# Patient Record
Sex: Male | Born: 1955 | Race: White | Hispanic: No | Marital: Married | Smoking: Never smoker
Health system: Southern US, Community
[De-identification: ages and names within clinical notes are randomized; demographics above are authoritative.]

## PROBLEM LIST (undated history)

## (undated) DIAGNOSIS — E291 Testicular hypofunction: Secondary | ICD-10-CM

## (undated) DIAGNOSIS — J302 Other seasonal allergic rhinitis: Secondary | ICD-10-CM

## (undated) DIAGNOSIS — G4730 Sleep apnea, unspecified: Secondary | ICD-10-CM

## (undated) HISTORY — PX: ROTATOR CUFF REPAIR: SHX139

## (undated) HISTORY — PX: SPINE SURGERY: SHX786

## (undated) HISTORY — DX: Testicular hypofunction: E29.1

## (undated) HISTORY — DX: Sleep apnea, unspecified: G47.30

## (undated) HISTORY — PX: EYE SURGERY: SHX253

---

## 2007-06-29 ENCOUNTER — Ambulatory Visit: Payer: Self-pay | Admitting: Gastroenterology

## 2008-12-08 ENCOUNTER — Observation Stay: Payer: Self-pay | Admitting: Internal Medicine

## 2009-07-06 IMAGING — CR DG CHEST 1V PORT
1 series · 1 of 1 positions shown · non-contrast
Comparison: none

REASON FOR EXAM: chest pain
COMMENTS:   LMP: (Male)

[view not recorded]
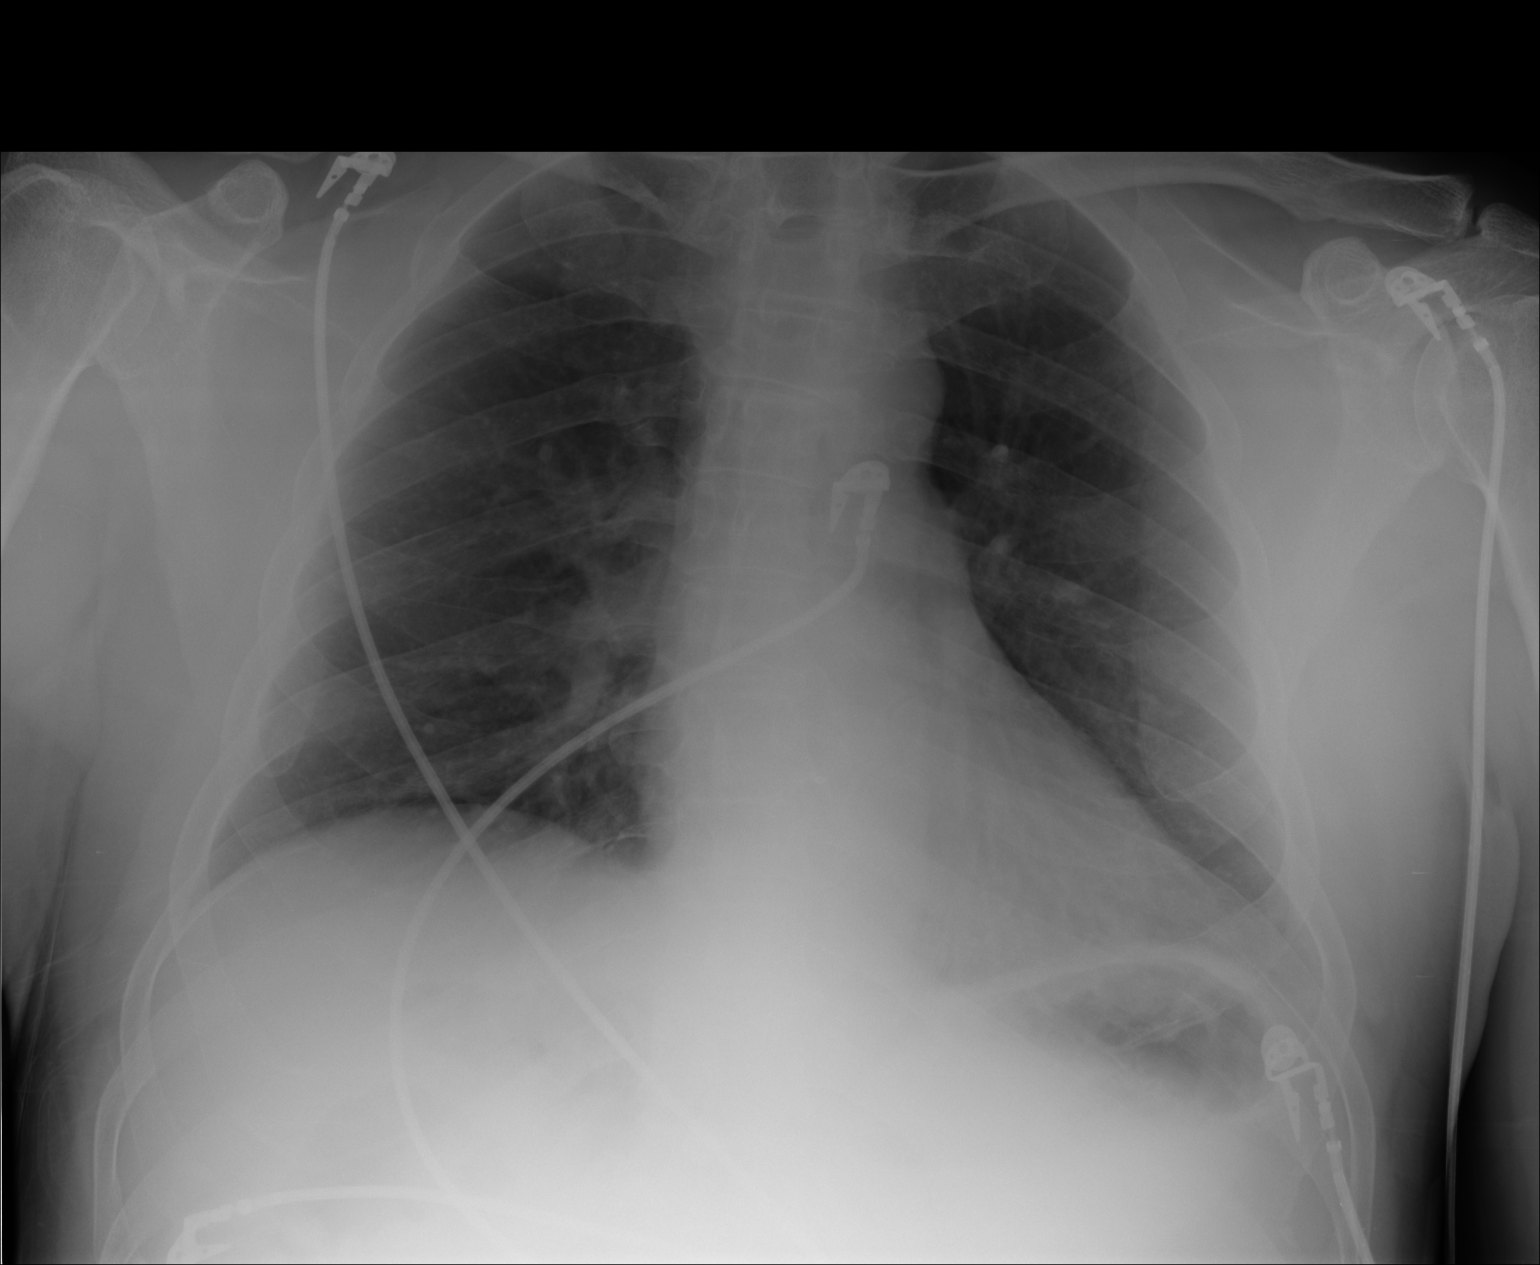

[1 of 1 positions shown; findings below may reference images not displayed]

PROCEDURE:     DXR - DXR PORTABLE CHEST SINGLE VIEW  - December 08, 2008  [DATE]

RESULT:     There is no previous exam for comparison.

Cardiac monitoring electrodes are present. The lungs are clear. The heart
and pulmonary vessels are normal. The bony and mediastinal structures are
unremarkable. There is no effusion. There is no pneumothorax or evidence of
congestive failure.
IMPRESSION: No acute cardiopulmonary disease. There is slightly shallow
inspiration.

## 2009-07-06 IMAGING — CT CT CHEST W/ CM
1 series · 16 of 33 positions shown, 20 images · non-contrast
Comparison: none

REASON FOR EXAM: sob, hi ddimer
COMMENTS:

[Series 4: soft tissue · axial · 0.73mm/px · z∈[-436,-115]mm · 16 of 117 slices shown, 20 images]
[im 5/117  mediastinal]
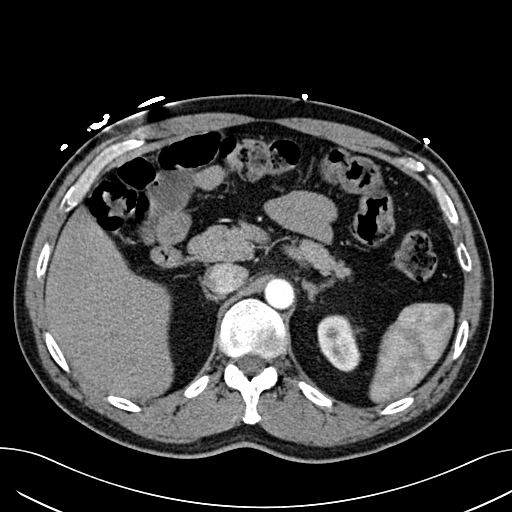
[im 5/117  lung]
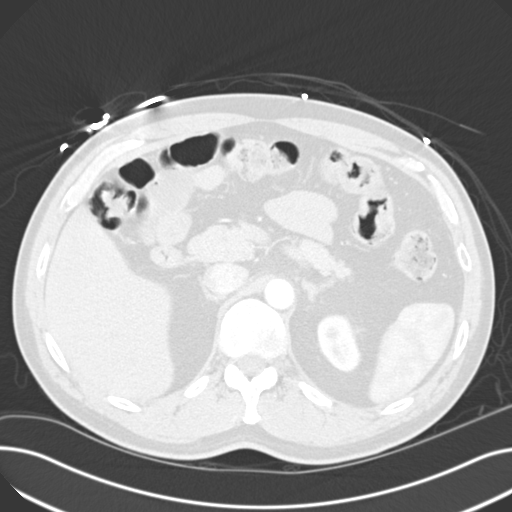
[im 13/117  lung]
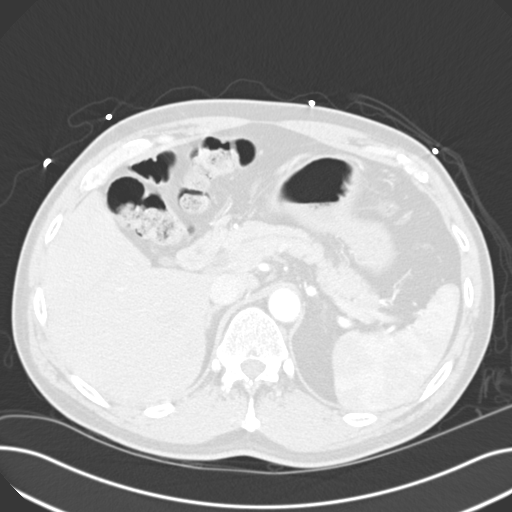
[im 22/117  lung]
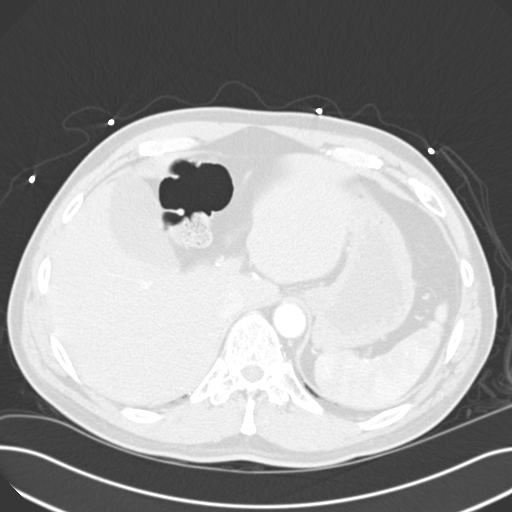
[im 26/117  lung]
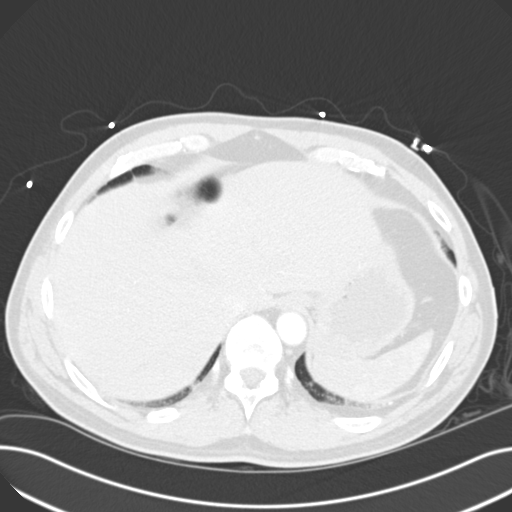
[im 35/117  mediastinal]
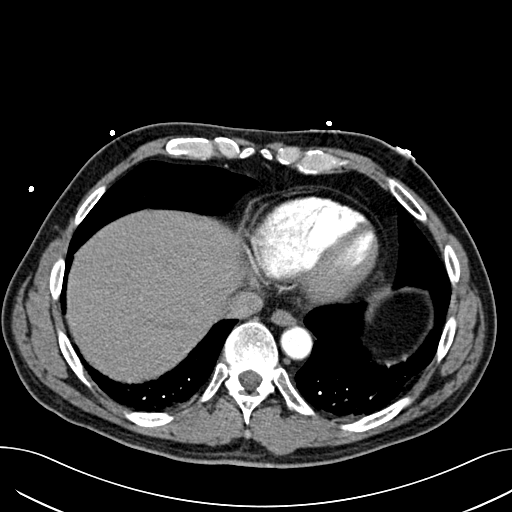
[im 35/117  lung]
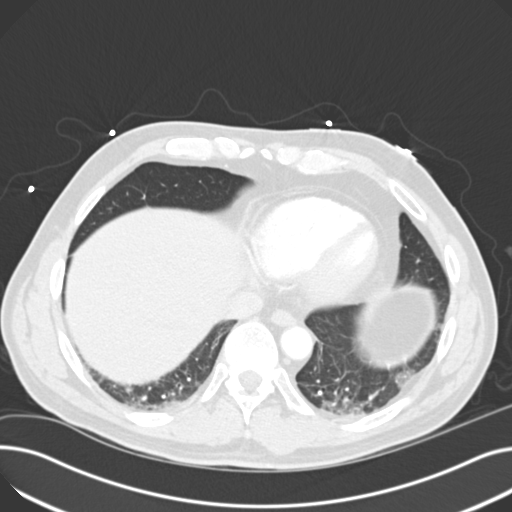
[im 43/117  lung]
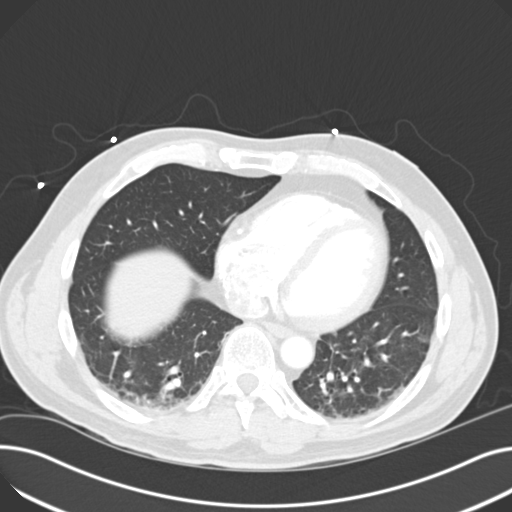
[im 48/117  lung]
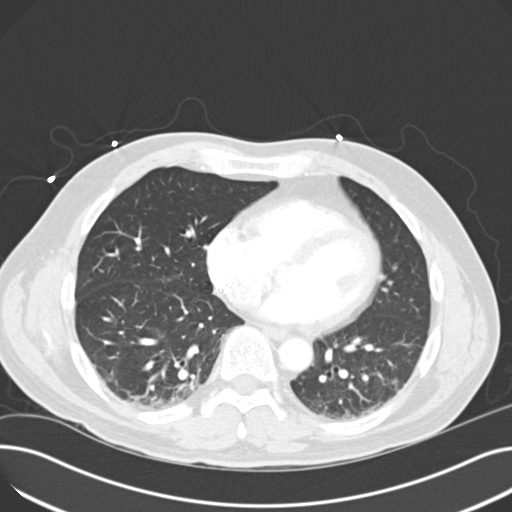
[im 56/117  lung]
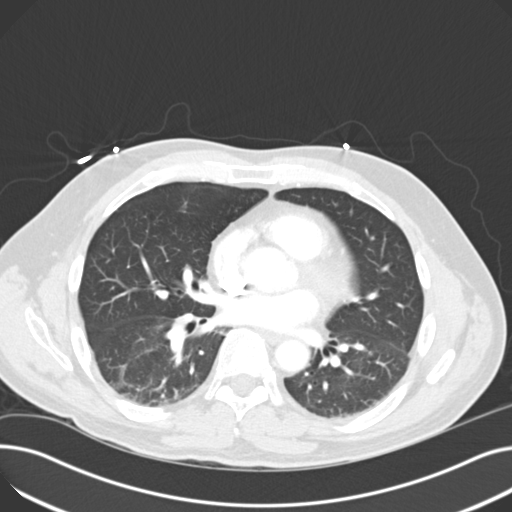
[im 62/117  mediastinal]
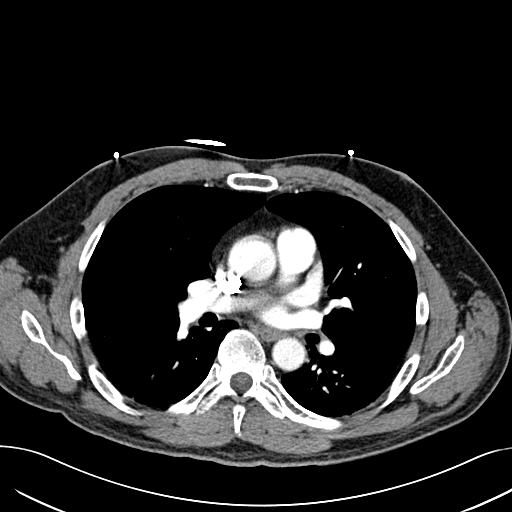
[im 62/117  lung]
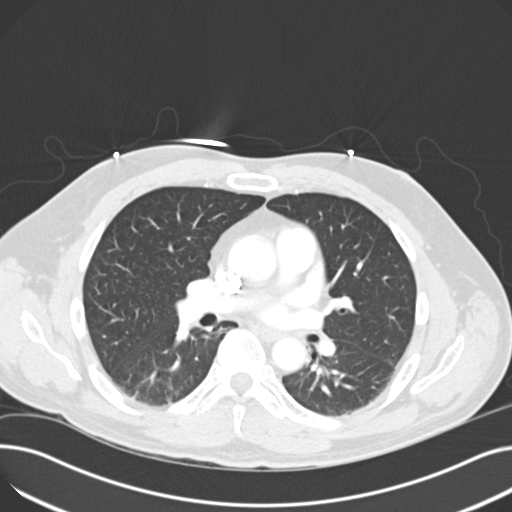
[im 69/117  lung]
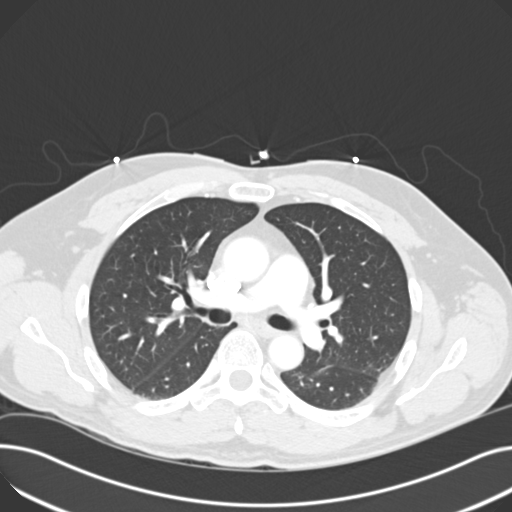
[im 74/117  lung]
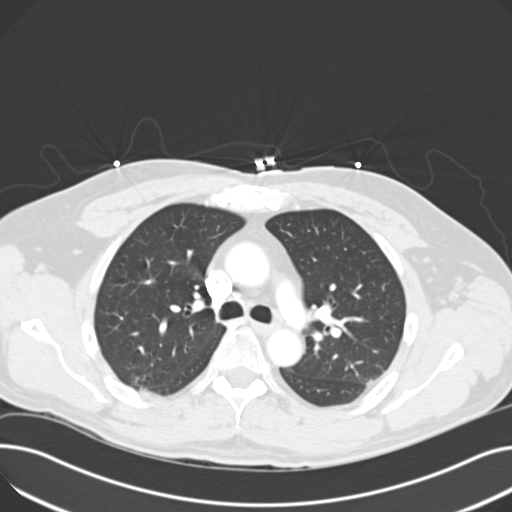
[im 82/117  lung]
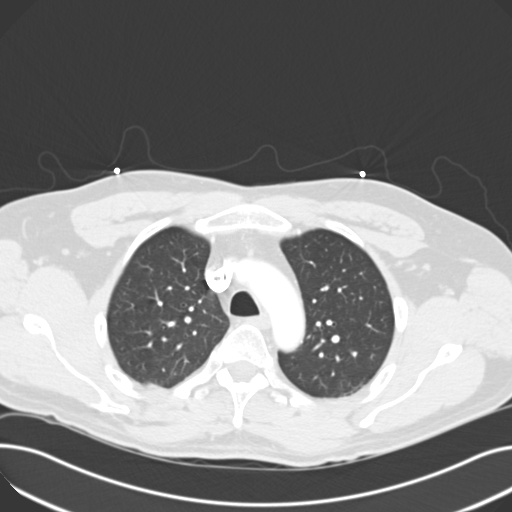
[im 91/117  mediastinal]
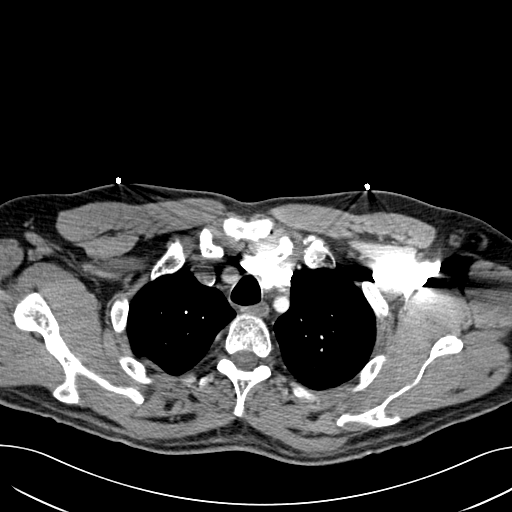
[im 91/117  lung]
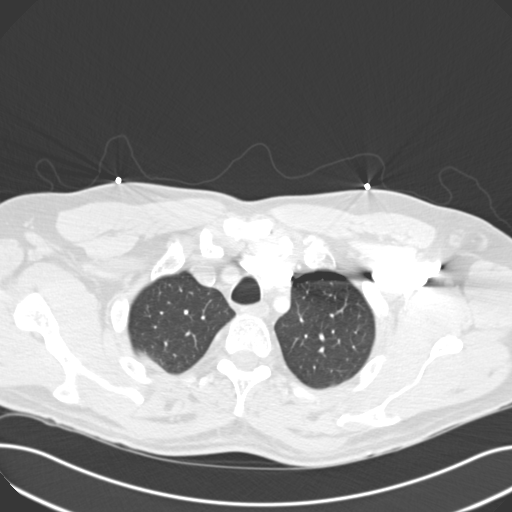
[im 95/117  lung]
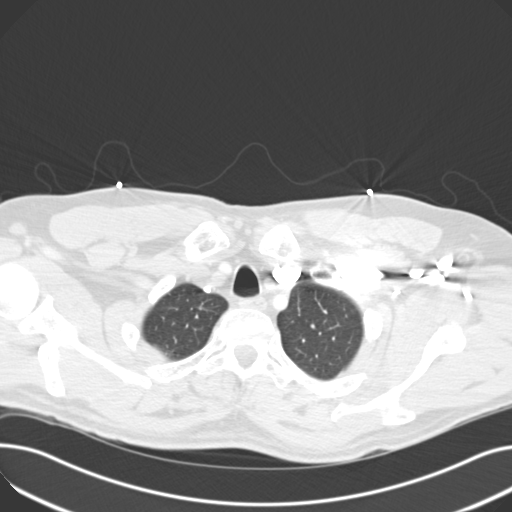
[im 104/117  lung]
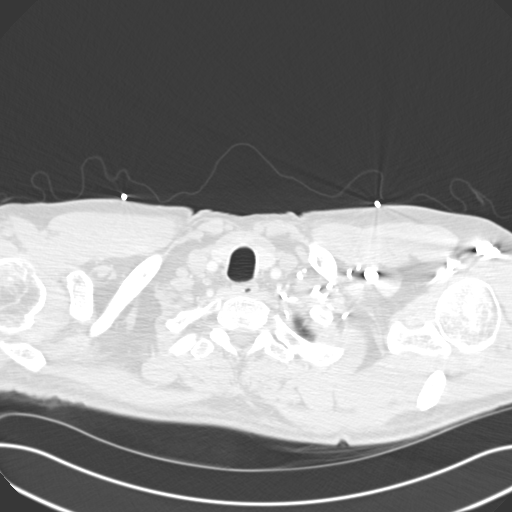
[im 112/117  lung]
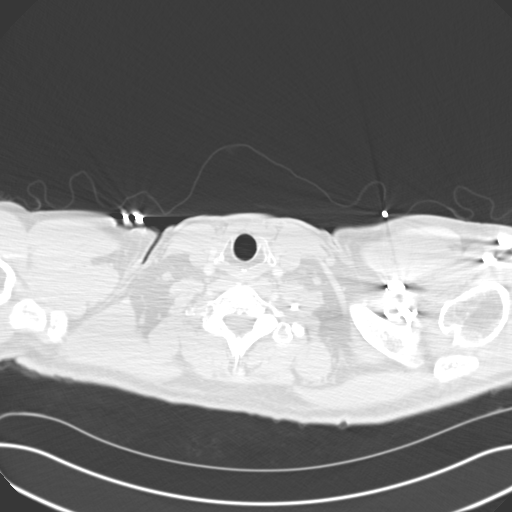

[16 of 33 positions shown; findings below may reference images not displayed]

PROCEDURE:     CT  - CT CHEST (FOR PE) W  - December 08, 2008  [DATE]

RESULT:     Emergent CT of the chest is performed utilizing approximately
100 ml of Wsovue-UHF iodinated intravenous contrast. There is no previous
examination for comparison. Images are reconstructed in the axial plane at 3
mm slice thickness. At the time of dictation multiplanar thin slices
reconstructions and 3 dimensional reconstructions are performed by the
interpreting physician utilizing the web space protocol. The abdominal aorta
is normal in caliber with no dissection or aneurysm. There is no pulmonary
embolism. There is no mediastinal or hilar mass or adenopathy. There is no
axillary adenopathy. The upper abdominal structures included on the same
appear unremarkable. There is no pericardial or pleural effusion. The lungs
are clear. There is no edema, infiltrate, mass or nodule. Dependent
atelectasis is present bilaterally.
IMPRESSION: 1. No pulmonary embolism.
2. No evidence of aortic aneurysm or dissection.

## 2013-02-09 ENCOUNTER — Ambulatory Visit: Payer: Self-pay | Admitting: Emergency Medicine

## 2013-02-09 LAB — DOT URINE DIP
Blood: NEGATIVE
Glucose,UR: NEGATIVE mg/dL (ref 0–75)
Protein: NEGATIVE
Specific Gravity: 1.02 (ref 1.003–1.030)

## 2015-01-09 ENCOUNTER — Ambulatory Visit: Payer: Self-pay

## 2015-01-09 LAB — DOT URINE DIP
Blood: NEGATIVE
GLUCOSE, UR: NEGATIVE
PROTEIN: NEGATIVE
Specific Gravity: 1.015 (ref 1.000–1.030)

## 2015-06-05 ENCOUNTER — Other Ambulatory Visit: Payer: Self-pay | Admitting: Family Medicine

## 2015-07-15 ENCOUNTER — Other Ambulatory Visit: Payer: Self-pay | Admitting: Family Medicine

## 2015-07-16 ENCOUNTER — Other Ambulatory Visit: Payer: Self-pay | Admitting: Family Medicine

## 2015-08-12 ENCOUNTER — Telehealth: Payer: Self-pay | Admitting: Family Medicine

## 2015-08-12 NOTE — Telephone Encounter (Signed)
Pt would like to know if an order can placed for testosterone. Pt would like a printed order before his appt on 10/01/15 @ 3pm. Please call pt when the order is ready to be picked up. Thanks.

## 2015-08-13 ENCOUNTER — Other Ambulatory Visit: Payer: Self-pay | Admitting: Family Medicine

## 2015-08-13 MED ORDER — TESTOSTERONE CYPIONATE 200 MG/ML IM SOLN: 50.0000 mg | INTRAMUSCULAR | Status: DC

## 2015-08-13 NOTE — Telephone Encounter (Signed)
Printed rx  

## 2015-08-15 ENCOUNTER — Telehealth: Payer: Self-pay | Admitting: Family Medicine

## 2015-08-15 NOTE — Telephone Encounter (Signed)
Pt came in and expected to pick up an order to get blood work done to check testosterone instead there was a rx and he is unsure why.

## 2015-08-18 NOTE — Telephone Encounter (Signed)
Call pt re testosterone, confusion over refilling patient's current prescription or blood work

## 2015-08-18 NOTE — Telephone Encounter (Signed)
Phone call Discussed with patient confusion over testosterone Patient has appointment next month Patient will come in this week to have morning testosterone drawn before 10:00 Reassess need for testosterone replacement therapy at that time

## 2015-09-19 ENCOUNTER — Other Ambulatory Visit: Payer: BLUE CROSS/BLUE SHIELD

## 2015-09-19 DIAGNOSIS — E291 Testicular hypofunction: Secondary | ICD-10-CM

## 2015-09-20 LAB — TESTOSTERONE: Testosterone: 245 ng/dL — ABNORMAL LOW (ref 348–1197)

## 2015-10-01 ENCOUNTER — Ambulatory Visit (INDEPENDENT_AMBULATORY_CARE_PROVIDER_SITE_OTHER): Payer: BLUE CROSS/BLUE SHIELD | Admitting: Family Medicine

## 2015-10-01 ENCOUNTER — Encounter: Payer: Self-pay | Admitting: Family Medicine

## 2015-10-01 VITALS — BP 106/74 | HR 72 | Temp 98.7°F | Ht 69.8 in | Wt 193.0 lb

## 2015-10-01 DIAGNOSIS — Z0000 Encounter for general adult medical examination without abnormal findings: Secondary | ICD-10-CM | POA: Diagnosis not present

## 2015-10-01 DIAGNOSIS — E291 Testicular hypofunction: Secondary | ICD-10-CM | POA: Diagnosis not present

## 2015-10-01 DIAGNOSIS — Z23 Encounter for immunization: Secondary | ICD-10-CM

## 2015-10-01 MED ORDER — FLUTICASONE PROPIONATE 50 MCG/ACT NA SUSP: 2.0000 | Freq: Every day | NASAL | Status: DC

## 2015-10-01 MED ORDER — TESTOSTERONE CYPIONATE 200 MG/ML IM SOLN: 100.0000 mg | INTRAMUSCULAR | Status: DC

## 2015-10-01 NOTE — Progress Notes (Signed)
BP 106/74 mmHg  Pulse 72  Temp(Src) 98.7 F (37.1 C)  Ht 5' 9.8" (1.773 m)  Wt 193 lb (87.544 kg)  BMI 27.85 kg/m2  SpO2 97%   Subjective:    Patient ID: William Numbers., male    DOB: 16-Feb-1956, 59 y.o.   MRN: [id number]  HPI: William Richardson. is a 59 y.o. male  Chief Complaint  Patient presents with  . Annual Exam   Patient doing well no complaints except for marked fatigue secondary to hypogonadal has not started testosterone replacement yet but ready to get going. His prescription was individual vials and wants multidose bottle.  Relevant past medical, surgical, family and social history reviewed and updated as indicated. Interim medical history since our last visit reviewed. Allergies and medications reviewed and updated.  Review of Systems  Constitutional: Negative.   HENT: Negative.   Eyes: Negative.   Respiratory: Negative.   Cardiovascular: Negative.   Gastrointestinal: Negative.   Endocrine: Negative.   Genitourinary: Negative.   Musculoskeletal: Negative.   Skin: Negative.   Allergic/Immunologic: Negative.   Neurological: Negative.   Hematological: Negative.   Psychiatric/Behavioral: Negative.     Per HPI unless specifically indicated above     Objective:    BP 106/74 mmHg  Pulse 72  Temp(Src) 98.7 F (37.1 C)  Ht 5' 9.8" (1.773 m)  Wt 193 lb (87.544 kg)  BMI 27.85 kg/m2  SpO2 97%  Wt Readings from Last 3 Encounters:  10/01/15 193 lb (87.544 kg)  02/27/15 196 lb (88.905 kg)    Physical Exam  Constitutional: He is oriented to person, place, and time. He appears well-developed and well-nourished.  HENT:  Head: Normocephalic and atraumatic.  Right Ear: External ear normal.  Left Ear: External ear normal.  Eyes: Conjunctivae and EOM are normal. Pupils are equal, round, and reactive to light.  Neck: Normal range of motion. Neck supple.  Cardiovascular: Normal rate, regular rhythm, normal heart sounds and intact distal  pulses.   Pulmonary/Chest: Effort normal and breath sounds normal.  Abdominal: Soft. Bowel sounds are normal. There is no splenomegaly or hepatomegaly.  Genitourinary: Rectum normal, prostate normal and penis normal.  Musculoskeletal: Normal range of motion.  Neurological: He is alert and oriented to person, place, and time. He has normal reflexes.  Skin: No rash noted. No erythema.  Psychiatric: He has a normal mood and affect. His behavior is normal. Judgment and thought content normal.    Results for orders placed or performed in visit on 09/19/15  Testosterone  Result Value Ref Range   Testosterone 245 (L) 348 - 1197 ng/dL   Comment, Testosterone Comment       Assessment & Plan:   Problem List Items Addressed This Visit      Endocrine   Hypogonadism in male    Other Visit Diagnoses    Immunization due    -  Primary    Relevant Orders    Flu Vaccine QUAD 36+ mos PF IM (Fluarix & Fluzone Quad PF) (Completed)    Tdap vaccine greater than or equal to 7yo IM (Completed)    PE (physical exam), annual        Relevant Orders    Comprehensive metabolic panel    CBC with Differential/Platelet    TSH    PSA    Urinalysis, Routine w reflex microscopic (not at [facility])        Follow up plan: Return in about 6 months (around 03/31/2016) for Recheck  meds with CBC, PSA and morning testosterone.Marland Kitchen

## 2015-11-17 ENCOUNTER — Telehealth: Payer: Self-pay | Admitting: Family Medicine

## 2015-11-17 NOTE — Telephone Encounter (Signed)
call

## 2015-11-17 NOTE — Telephone Encounter (Signed)
pts insurance isn't covering visit and would like it to be recoded. plz call pt for further explanation.

## 2015-11-21 NOTE — Progress Notes (Signed)
This encounter was created in error - please disregard.

## 2016-05-21 ENCOUNTER — Other Ambulatory Visit: Payer: Self-pay | Admitting: Family Medicine

## 2016-05-21 NOTE — Telephone Encounter (Signed)
Must have apt to check before refills (with anyone)

## 2016-05-24 ENCOUNTER — Encounter: Payer: Self-pay | Admitting: Family Medicine

## 2016-05-24 NOTE — Telephone Encounter (Signed)
Letter sent.

## 2016-10-07 ENCOUNTER — Ambulatory Visit (INDEPENDENT_AMBULATORY_CARE_PROVIDER_SITE_OTHER): Payer: BLUE CROSS/BLUE SHIELD | Admitting: Family Medicine

## 2016-10-07 ENCOUNTER — Encounter: Payer: Self-pay | Admitting: Family Medicine

## 2016-10-07 VITALS — BP 117/80 | HR 58 | Temp 97.9°F | Ht 70.4 in | Wt 195.0 lb

## 2016-10-07 DIAGNOSIS — Z0000 Encounter for general adult medical examination without abnormal findings: Secondary | ICD-10-CM

## 2016-10-07 DIAGNOSIS — E291 Testicular hypofunction: Secondary | ICD-10-CM

## 2016-10-07 NOTE — Patient Instructions (Signed)
Follow up as needed

## 2016-10-07 NOTE — Assessment & Plan Note (Signed)
Stable, not currently on testosterone replacement

## 2016-10-07 NOTE — Progress Notes (Signed)
BP 117/80 (BP Location: Right Arm, Patient Position: Sitting, Cuff Size: Normal)   Pulse (!) 58   Temp 97.9 F (36.6 C)   Ht 5' 10.4" (1.788 m)   Wt 195 lb (88.5 kg)   SpO2 98%   BMI 27.66 kg/m    Subjective:    Patient ID: William Numbers., male    DOB: 09-03-1956, 60 y.o.   MRN: [id number]  HPI: William Bea. is a 60 y.o. male presenting on 10/07/2016 for comprehensive medical examination. Current medical complaints include:none  Interim Problems from his last visit: no  Depression Screen done today and results listed below:  Depression screen [facility] 2/9 10/07/2016  Decreased Interest 0  Down, Depressed, Hopeless 0  PHQ - 2 Score 0  Altered sleeping 0  Tired, decreased energy 0  Change in appetite 0  Feeling bad or failure about yourself  0  Trouble concentrating 0  Moving slowly or fidgety/restless 0  Suicidal thoughts 0  PHQ-9 Score 0    The patient does not have a history of falls. I did not complete a risk assessment for falls. A plan of care for falls was not documented.   Past Medical History:  Past Medical History:  Diagnosis Date  . Hypogonadism in male     Surgical History:  Past Surgical History:  Procedure Laterality Date  . EYE SURGERY Bilateral    cataract  . SPINE SURGERY      Medications:  Current Outpatient Prescriptions on File Prior to Visit  Medication Sig  . fluticasone (FLONASE) 50 MCG/ACT nasal spray Place 2 sprays into both nostrils daily.  . valACYclovir (VALTREX) 500 MG tablet TAKE 4 TABLETS BY MOUTH NOW AND REPEAT IN 12 HOURS  . testosterone cypionate (DEPOTESTOSTERONE CYPIONATE) 200 MG/ML injection Inject 0.5 mLs (100 mg total) into the muscle once a week. (Patient not taking: Reported on 10/07/2016)   No current facility-administered medications on file prior to visit.     Allergies:  No Known Allergies  Social History:  Social History   Social History  . Marital status: Married    Spouse name:  N/A  . Number of children: N/A  . Years of education: N/A   Occupational History  . Not on file.   Social History Main Topics  . Smoking status: Never Smoker  . Smokeless tobacco: Never Used  . Alcohol use No  . Drug use: No  . Sexual activity: Not on file   Other Topics Concern  . Not on file   Social History Narrative  . No narrative on file   History  Smoking Status  . Never Smoker  Smokeless Tobacco  . Never Used   History  Alcohol Use No    Family History:  Family History  Problem Relation Age of Onset  . Cancer Mother   . Dementia Mother   . Diabetes Brother     Past medical history, surgical history, medications, allergies, family history and social history reviewed with patient today and changes made to appropriate areas of the chart.   Review of Systems - General ROS: negative Psychological ROS: negative Ophthalmic ROS: negative ENT ROS: negative Endocrine ROS: negative Respiratory ROS: no cough, shortness of breath, or wheezing Cardiovascular ROS: no chest pain or dyspnea on exertion Gastrointestinal ROS: no abdominal pain, change in bowel habits, or black or bloody stools Genito-Urinary ROS: no dysuria, trouble voiding, or hematuria Musculoskeletal ROS: negative Neurological ROS: no TIA or stroke symptoms Dermatological ROS: negative  All other ROS negative except what is listed above and in the HPI.      Objective:    BP 117/80 (BP Location: Right Arm, Patient Position: Sitting, Cuff Size: Normal)   Pulse (!) 58   Temp 97.9 F (36.6 C)   Ht 5' 10.4" (1.788 m)   Wt 195 lb (88.5 kg)   SpO2 98%   BMI 27.66 kg/m   Wt Readings from Last 3 Encounters:  10/07/16 195 lb (88.5 kg)  10/01/15 193 lb (87.5 kg)  02/27/15 196 lb (88.9 kg)    Physical Exam  Constitutional: He is oriented to person, place, and time. He appears well-developed and well-nourished. No distress.  HENT:  Head: Atraumatic.  Right Ear: External ear normal.  Left Ear:  External ear normal.  Nose: Nose normal.  Mouth/Throat: Oropharynx is clear and moist. No oropharyngeal exudate.  Eyes: Conjunctivae and EOM are normal. Pupils are equal, round, and reactive to light. No scleral icterus.  Neck: Normal range of motion. Neck supple.  Cardiovascular: Normal rate, regular rhythm and normal heart sounds.   Pulmonary/Chest: Effort normal and breath sounds normal. No respiratory distress. He has no wheezes. He has no rales.  Abdominal: Soft. Bowel sounds are normal. There is no tenderness.  Musculoskeletal: Normal range of motion. He exhibits no edema or tenderness.  Lymphadenopathy:    He has no cervical adenopathy.  Neurological: He is alert and oriented to person, place, and time. No cranial nerve deficit. He exhibits normal muscle tone.  Skin: Skin is warm and dry. No rash noted.  Psychiatric: He has a normal mood and affect. His behavior is normal.  Nursing note and vitals reviewed.     Assessment & Plan:   Problem List Items Addressed This Visit      Endocrine   Hypogonadism in male    Stable, not currently on testosterone replacement       Other Visit Diagnoses    Annual physical exam    -  Primary   Labs done previously through [facility]       Discussed aspirin prophylaxis for myocardial infarction prevention and decision was it was not indicated  LABORATORY TESTING:  Health maintenance labs ordered today as discussed above.   The natural history of prostate cancer and ongoing controversy regarding screening and potential treatment outcomes of prostate cancer has been discussed with the patient. The meaning of a false positive PSA and a false negative PSA has been discussed. He indicates understanding of the limitations of this screening test and wishes not to proceed with screening PSA testing.   IMMUNIZATIONS:   - Tdap: Tetanus vaccination status reviewed: last tetanus booster within 10 years. - Influenza: Refused - Zostavax vaccine: Up  to date  SCREENING: - Colonoscopy: Up to date  Discussed with patient purpose of the colonoscopy is to detect colon cancer at curable precancerous or early stages   PATIENT COUNSELING:    Sexuality: Discussed sexually transmitted diseases, partner selection, use of condoms, avoidance of unintended pregnancy  and contraceptive alternatives.   Advised to avoid cigarette smoking.  I discussed with the patient that most people either abstain from alcohol or drink within safe limits (<=14/week and <=4 drinks/occasion for males, <=7/weeks and <= 3 drinks/occasion for females) and that the risk for alcohol disorders and other health effects rises proportionally with the number of drinks per week and how often a drinker exceeds daily limits.  Discussed cessation/primary prevention of drug use and availability of treatment for abuse.  Diet: Encouraged to adjust caloric intake to maintain  or achieve ideal body weight, to reduce intake of dietary saturated fat and total fat, to limit sodium intake by avoiding high sodium foods and not adding table salt, and to maintain adequate dietary potassium and calcium preferably from fresh fruits, vegetables, and low-fat dairy products.    stressed the importance of regular exercise  Injury prevention: Discussed safety belts, safety helmets, smoke detector, smoking near bedding or upholstery.   Dental health: Discussed importance of regular tooth brushing, flossing, and dental visits.   Follow up plan: NEXT PREVENTATIVE PHYSICAL DUE IN 1 YEAR. Return in about 1 year (around 10/07/2017) for Physical Exam.

## 2016-12-10 ENCOUNTER — Encounter: Payer: Self-pay | Admitting: *Deleted

## 2016-12-10 ENCOUNTER — Ambulatory Visit
Admission: EM | Admit: 2016-12-10 | Discharge: 2016-12-10 | Disposition: A | Discharge status: Home / Self Care | Payer: Self-pay | Attending: Internal Medicine | Admitting: Internal Medicine

## 2016-12-10 DIAGNOSIS — Z024 Encounter for examination for driving license: Secondary | ICD-10-CM

## 2016-12-10 LAB — DEPT OF TRANSP DIPSTICK, URINE (ARMC ONLY)
Bilirubin Urine: NEGATIVE
GLUCOSE, UA: NEGATIVE mg/dL
Hgb urine dipstick: NEGATIVE
Ketones, ur: NEGATIVE mg/dL
Leukocytes, UA: NEGATIVE
NITRITE: NEGATIVE
PH: 6 (ref 5.0–8.0)
PROTEIN: NEGATIVE mg/dL
SPECIFIC GRAVITY, URINE: 1.015 (ref 1.005–1.030)

## 2016-12-10 NOTE — Discharge Instructions (Signed)
You have been certified for 2 years. Follow-up in 2 years for repeat exam.

## 2016-12-10 NOTE — ED Provider Notes (Signed)
CSN: [id number]     Arrival date & time 12/10/16  0831 History   First MD Initiated Contact with Patient 12/10/16 [phone]     Chief Complaint  Patient presents with  . Commercial Driver's License Exam   (Consider location/radiation/quality/duration/timing/severity/associated sxs/prior Treatment) 60 year old male presents for a routine DOT physical. Last DOT physical was 2 years ago. No chronic health issues. Had recent physical at his PCP with no concerns. Did have history of chest pain about 3 years ago, had full work-up with cardiac cath- no etiology found. Has not had any chest pain, difficulty breathing or other symptoms since. Also had cervical spinal surgery 30 years ago- no symptoms, pain or difficulties since- has full range of motion of neck. Occasionally takes Flonase for seasonal allergies. No other concerns or complaints.    The history is provided by the patient.    Past Medical History:  Diagnosis Date  . Hypogonadism in male    Past Surgical History:  Procedure Laterality Date  . EYE SURGERY Bilateral    cataract  . SPINE SURGERY     Family History  Problem Relation Age of Onset  . Cancer Mother   . Dementia Mother   . Diabetes Brother    Social History  Substance Use Topics  . Smoking status: Never Smoker  . Smokeless tobacco: Never Used  . Alcohol use No    Review of Systems  Constitutional: Negative.   HENT: Negative.   Eyes: Positive for visual disturbance (is color impaired- but able to distinguish red and green).  Respiratory: Negative.   Cardiovascular: Negative for chest pain, palpitations and leg swelling.  Gastrointestinal: Negative.   Endocrine: Negative.   Genitourinary: Negative.   Musculoskeletal: Negative for arthralgias, back pain, gait problem, joint swelling, myalgias, neck pain and neck stiffness.  Skin: Negative.   Allergic/Immunologic: Negative.   Neurological: Negative.   Hematological: Negative.   Psychiatric/Behavioral: Negative.      Allergies  Patient has no known allergies.  Home Medications   Prior to Admission medications   Medication Sig Start Date End Date Taking? Authorizing Provider  fluticasone (FLONASE) 50 MCG/ACT nasal spray Place 2 sprays into both nostrils daily. 10/01/15   Guadalupe Maple, MD   Meds Ordered and Administered this Visit  Medications - No data to display  BP 120/81 (BP Location: Left Arm)   Pulse 85   Temp 97.5 F (36.4 C) (Oral)   Resp 16   Ht 5\' 10"  (1.778 m)   Wt 192 lb (87.1 kg)   SpO2 97%   BMI 27.55 kg/m  No data found.   Physical Exam  Constitutional: He is oriented to person, place, and time. He appears well-developed and well-nourished. No distress.  HENT:  Head: Normocephalic and atraumatic.  Right Ear: Hearing, tympanic membrane, external ear and ear canal normal.  Left Ear: Hearing, tympanic membrane, external ear and ear canal normal.  Nose: Nose normal.  Mouth/Throat: Uvula is midline, oropharynx is clear and moist and mucous membranes are normal.  Eyes: Conjunctivae, EOM and lids are normal. Pupils are equal, round, and reactive to light. Right eye exhibits no discharge. Left eye exhibits no discharge.  Neck: Normal range of motion and full passive range of motion without pain. Neck supple.    Scar from previous neck surgery  Cardiovascular: Normal rate, regular rhythm, normal heart sounds, intact distal pulses and normal pulses.  Exam reveals no friction rub.   No murmur heard. Pulmonary/Chest: Effort normal and breath  sounds normal. No respiratory distress. He has no decreased breath sounds. He has no wheezes. He has no rhonchi.  Abdominal: Soft. Normal appearance and bowel sounds are normal. He exhibits no distension. There is no hepatosplenomegaly. There is no tenderness. No hernia. Hernia confirmed negative in the right inguinal area and confirmed negative in the left inguinal area.  Musculoskeletal: Normal range of motion.  Neurological: He is  alert and oriented to person, place, and time. He has normal strength and normal reflexes. No cranial nerve deficit or sensory deficit. He displays a negative Romberg sign.  Skin: Skin is warm and dry. Capillary refill takes less than 2 seconds.  Psychiatric: He has a normal mood and affect. His behavior is normal. Judgment and thought content normal.    Urgent Care Course   Clinical Course     Procedures (including critical care time)  Labs Review Labs Reviewed  DEPT OF TRANSP DIPSTICK, URINE([facility] ONLY)    Imaging Review No results found.   Visual Acuity Review   Right Eye Distance:  20/25 Uncorrected  Left Eye Distance:  20/20 Bilateral Distance:  20/20   Right Eye Near:   Left Eye Near:    Bilateral Near:         MDM   1. Encounter for commercial driver medical examination (CDME)    Reviewed negative urinalysis results with patient. No abnormalities on exam detected. Patient certified for 2 years. Paperwork completed- see DOT paperwork for details. Follow-up in 2 years for repeat exam.     Katy Apo, NP 12/10/16 1109

## 2016-12-10 NOTE — ED Triage Notes (Signed)
DOT physical. 

## 2017-04-06 ENCOUNTER — Other Ambulatory Visit: Payer: Self-pay | Admitting: Family Medicine

## 2017-04-06 NOTE — Telephone Encounter (Signed)
NOT ON CURRENT MED LIST!    Last routine OV: 09/19/2016 Next OV: 10/18/2017

## 2017-04-06 NOTE — Telephone Encounter (Signed)
Message relayed to patient. Verbalized understanding and denied questions. Pt will hold off for the time being will call back to schedule if he changed his mind.

## 2017-04-06 NOTE — Telephone Encounter (Signed)
Call patient requesting refill for testosterone last prescription was given in 2016 patient must have office visit prior to any prescriptions. In general now testosterone replacement is being managed by urology due to the current political climate on controlled drugs.

## 2017-05-12 ENCOUNTER — Ambulatory Visit
Admission: EM | Admit: 2017-05-12 | Discharge: 2017-05-12 | Disposition: A | Discharge status: Home / Self Care | Payer: Worker's Compensation | Attending: Family Medicine | Admitting: Family Medicine

## 2017-05-12 ENCOUNTER — Ambulatory Visit (INDEPENDENT_AMBULATORY_CARE_PROVIDER_SITE_OTHER): Payer: Worker's Compensation

## 2017-05-12 ENCOUNTER — Encounter: Payer: Self-pay | Admitting: *Deleted

## 2017-05-12 DIAGNOSIS — M7520 Bicipital tendinitis, unspecified shoulder: Secondary | ICD-10-CM

## 2017-05-12 DIAGNOSIS — S40012A Contusion of left shoulder, initial encounter: Secondary | ICD-10-CM

## 2017-05-12 MED ORDER — MELOXICAM 15 MG PO TABS: 15.0000 mg | ORAL_TABLET | Freq: Every day | ORAL | 0 refills | Status: DC

## 2017-05-12 NOTE — ED Triage Notes (Signed)
Pt was pulling a box off of a shelf overhead and it stuck then released suddenly jerking left shoulder. Now c/o left shoulder pain and decreased ROM, injury occurred yesterday.

## 2017-05-12 NOTE — ED Provider Notes (Signed)
[facility]    CSN: [id number] Arrival date & time: 05/12/17  1309     History   Chief Complaint Chief Complaint  Patient presents with  . Shoulder Injury    HPI William Richardson. is a 61 y.o. male.   Patient is a 61 year old white male works for load [employer]. States that he was unloading the truck yesterday where a rack of box of food was stuck. He states that back to work to get loose and when he did finally get loose and went lying in him and bridge of his nose and then along the left shoulder. States that since the injury he's had difficulty with left shoulder. Other than the bruising of the left nose he states no trouble with the no facial or nasal area. Please having trouble is a left shoulder he has pain when he tries to move his shoulder to use his shoulder or any activity with shoulder. Past history of hypogonadism. History of eye and spine surgery. No loss of consciousness with the accident. No known drug allergies he does not smoke. His mother has cancer dementia but no pertinent family medical history relevant to today's visit.   The history is provided by the patient. No language interpreter was used.  Shoulder Injury  This is a new problem. The current episode started yesterday. The problem occurs constantly. The problem has been gradually worsening. Pertinent negatives include no chest pain, no abdominal pain, no headaches and no shortness of breath. The symptoms are aggravated by exertion. Nothing relieves the symptoms. He has tried nothing for the symptoms. The treatment provided no relief.    Past Medical History:  Diagnosis Date  . Hypogonadism in male     Patient Active Problem List   Diagnosis Date Noted  . Hypogonadism in male 10/01/2015    Past Surgical History:  Procedure Laterality Date  . EYE SURGERY Bilateral    cataract  . SPINE SURGERY         Home Medications    Prior to Admission medications   Medication Sig  Start Date End Date Taking? Authorizing Provider  fluticasone (FLONASE) 50 MCG/ACT nasal spray Place 2 sprays into both nostrils daily. 10/01/15   Guadalupe Maple, MD  meloxicam (MOBIC) 15 MG tablet Take 1 tablet (15 mg total) by mouth daily. 05/12/17   Frederich Cha, MD    Family History Family History  Problem Relation Age of Onset  . Cancer Mother   . Dementia Mother   . Diabetes Brother     Social History Social History  Substance Use Topics  . Smoking status: Never Smoker  . Smokeless tobacco: Never Used  . Alcohol use No     Allergies   Patient has no known allergies.   Review of Systems Review of Systems  Respiratory: Negative for shortness of breath.   Cardiovascular: Negative for chest pain.  Gastrointestinal: Negative for abdominal pain.  Musculoskeletal: Positive for myalgias.  Neurological: Negative for headaches.  All other systems reviewed and are negative.    Physical Exam Triage Vital Signs ED Triage Vitals  Enc Vitals Group     BP 05/12/17 1347 100/66     Pulse Rate 05/12/17 1347 86     Resp 05/12/17 1347 16     Temp 05/12/17 1347 98.2 F (36.8 C)     Temp Source 05/12/17 1347 Oral     SpO2 05/12/17 1347 98 %     Weight 05/12/17 1347 185 lb (  83.9 kg)     Height 05/12/17 1347 5\' 11"  (1.803 m)     Head Circumference --      Peak Flow --      Pain Score 05/12/17 1348 9     Pain Loc --      Pain Edu? --      Excl. in [place]? --    No data found.   Updated Vital Signs BP 100/66 (BP Location: Left Arm)   Pulse 86   Temp 98.2 F (36.8 C) (Oral)   Resp 16   Ht 5\' 11"  (1.803 m)   Wt 185 lb (83.9 kg)   SpO2 98%   BMI 25.80 kg/m   Visual Acuity Right Eye Distance:   Left Eye Distance:   Bilateral Distance:    Right Eye Near:   Left Eye Near:    Bilateral Near:     Physical Exam  Constitutional: He is oriented to person, place, and time. He appears well-developed and well-nourished.  HENT:  Head: Normocephalic. Head is with  contusion. Head is without Battle's sign.    Right Ear: External ear normal.  Left Ear: External ear normal.  Bruising over the nasal area  Eyes: Pupils are equal, round, and reactive to light.  Neck: Normal range of motion.  Pulmonary/Chest: Effort normal.  Musculoskeletal: He exhibits tenderness.       Left shoulder: He exhibits decreased range of motion, tenderness and bony tenderness.  Patient has tenderness over the left shoulder the pain appears be over the biceps tendon consistent with a biceps tendinitis. Limited range of motion the patient able to invert his hand with his arms straight out and resist pressure and with prompting able to raise his left arm above his head.  Neurological: He is alert and oriented to person, place, and time.  Skin: Skin is warm.  Psychiatric: He has a normal mood and affect.     UC Treatments / Results  Labs (all labs ordered are listed, but only abnormal results are displayed) Labs Reviewed - No data to display  EKG  EKG Interpretation None       Radiology Dg Shoulder Left  Result Date: 05/12/2017 CLINICAL DATA:  61 year old male status post blunt trauma to the left shoulder while lifting overhead yesterday. Pain and decreased range of motion. EXAM: LEFT SHOULDER - 2+ VIEW COMPARISON:  Chest CT 12/08/2008 FINDINGS: No glenohumeral joint dislocation. Bone mineralization is within normal limits for age. Intact proximal left humerus. Left clavicle and scapula appear intact and within normal limits. Negative visible left ribs and lung parenchyma. IMPRESSION: Normal for age radiographic appearance of the left shoulder. Electronically Signed   By: Genevie Ann M.D.   On: 05/12/2017 14:26    Procedures Procedures (including critical care time)  Medications Ordered in UC Medications - No data to display   Initial Impression / Assessment and Plan / UC Course  I have reviewed the triage vital signs and the nursing notes.  Pertinent labs & imaging  results that were available during my care of the patient were reviewed by me and considered in my medical decision making (see chart for details).     We'll place patient in a sling put on Mobic 15 mg refer patient to miss Adelina Mings at [facility]. He will need to call to make appointment to be seen next week we'll give him a work note for today but explained to him why he needs to go back to work tomorrow but  not lifting anything with his left arm or left upper extremities while at work until cleared by Ms Laurance Flatten.     Final Clinical Impressions(s) / UC Diagnoses   Final diagnoses:  Contusion of left shoulder, initial encounter  Biceps tendinitis, unspecified laterality    New Prescriptions New Prescriptions   MELOXICAM (MOBIC) 15 MG TABLET    Take 1 tablet (15 mg total) by mouth daily.    Note: This dictation was prepared with Dragon dictation along with smaller phrase technology. Any transcriptional errors that result from this process are unintentional.   Frederich Cha, MD 05/12/17 [id number]

## 2017-05-23 ENCOUNTER — Telehealth: Payer: Self-pay | Admitting: Family Medicine

## 2017-05-23 NOTE — Telephone Encounter (Signed)
Called and spoke to patient. Verified that unfortunately we are unable to send to [facility] due to [facility] not having any requisitions for them. Pt understood. Pt was given address of [facility] draw center near [facility].

## 2017-10-13 ENCOUNTER — Encounter: Payer: Self-pay | Admitting: Family Medicine

## 2017-10-13 ENCOUNTER — Ambulatory Visit (INDEPENDENT_AMBULATORY_CARE_PROVIDER_SITE_OTHER): Payer: BLUE CROSS/BLUE SHIELD | Admitting: Family Medicine

## 2017-10-13 VITALS — BP 113/80 | HR 47 | Ht 71.0 in | Wt 189.0 lb

## 2017-10-13 DIAGNOSIS — Z0000 Encounter for general adult medical examination without abnormal findings: Secondary | ICD-10-CM

## 2017-10-13 DIAGNOSIS — E291 Testicular hypofunction: Secondary | ICD-10-CM | POA: Diagnosis not present

## 2017-10-13 DIAGNOSIS — Z23 Encounter for immunization: Secondary | ICD-10-CM

## 2017-10-13 NOTE — Progress Notes (Signed)
BP 113/80   Pulse (!) 47   Ht 5\' 11"  (1.803 m)   Wt 189 lb (85.7 kg)   SpO2 95%   BMI 26.36 kg/m    Subjective:    Patient ID: William Numbers., male    DOB: [birth year], 61 y.o.   MRN: [id number]  HPI: [name]. is a 61 y.o. male  Annual exam  Patient all in all doing well no complaints is concerned about some actinic keratosis on the scalp.. Had shoulder surgery and is recovering still taking some occasional tramadol and takes meloxicam pretty much every day. Patient's blood work done at work after the new year. Relevant past medical, surgical, family and social history reviewed and updated as indicated. Interim medical history since our last visit reviewed. Allergies and medications reviewed and updated.  Review of Systems  Constitutional: Negative.   HENT: Negative.   Eyes: Negative.   Respiratory: Negative.   Cardiovascular: Negative.   Gastrointestinal: Negative.   Endocrine: Negative.   Genitourinary: Negative.   Musculoskeletal: Negative.   Skin: Negative.   Allergic/Immunologic: Negative.   Neurological: Negative.   Hematological: Negative.   Psychiatric/Behavioral: Negative.     Per HPI unless specifically indicated above     Objective:    BP 113/80   Pulse (!) 47   Ht 5\' 11"  (1.803 m)   Wt 189 lb (85.7 kg)   SpO2 95%   BMI 26.36 kg/m   Wt Readings from Last 3 Encounters:  10/13/17 189 lb (85.7 kg)  05/12/17 185 lb (83.9 kg)  12/10/16 192 lb (87.1 kg)    Physical Exam  Constitutional: He is oriented to person, place, and time. He appears well-developed and well-nourished.  HENT:  Head: Normocephalic and atraumatic.  Right Ear: External ear normal.  Left Ear: External ear normal.  Eyes: Pupils are equal, round, and reactive to light. Conjunctivae and EOM are normal.  Neck: Normal range of motion. Neck supple.  Cardiovascular: Normal rate, regular rhythm, normal heart sounds and intact distal pulses.     Pulmonary/Chest: Effort normal and breath sounds normal.  Abdominal: Soft. Bowel sounds are normal. There is no splenomegaly or hepatomegaly.  Genitourinary: Rectum normal, prostate normal and penis normal.  Musculoskeletal: Normal range of motion.  Neurological: He is alert and oriented to person, place, and time. He has normal reflexes.  Skin: No rash noted. No erythema.  Scalp with multiple actinic keratosis patient has a creamy as an use reviewed with patient and encouraged use.  Psychiatric: He has a normal mood and affect. His behavior is normal. Judgment and thought content normal.    Results for orders placed or performed during the hospital encounter of 12/10/16  Dept of Transp dipstick, urine  Result Value Ref Range   Protein, ur NEGATIVE NEGATIVE mg/dL   Glucose, UA NEGATIVE NEGATIVE mg/dL   Specific Gravity, Urine 1.015 1.005 - 1.030   Hgb urine dipstick NEGATIVE NEGATIVE   Color, Urine YELLOW YELLOW   APPearance CLEAR CLEAR   pH 6.0 5.0 - 8.0   Bilirubin Urine NEGATIVE NEGATIVE   Ketones, ur NEGATIVE NEGATIVE mg/dL   Nitrite NEGATIVE NEGATIVE   Leukocytes, UA NEGATIVE NEGATIVE      Assessment & Plan:   Problem List Items Addressed This Visit      Endocrine   Hypogonadism in male - Primary    Other Visit Diagnoses    Needs flu shot       PE (physical exam), annual  No blood work is being done at work.  Follow up plan: Return for Physical Exam.

## 2017-10-20 ENCOUNTER — Telehealth: Payer: Self-pay | Admitting: Family Medicine

## 2017-10-20 NOTE — Telephone Encounter (Signed)
Pt called and would like a call back regarding his colonoscopy.

## 2017-10-20 NOTE — Telephone Encounter (Signed)
Patient called and stated that he called to schedule Colonoscopy and was told that [facility] Never received a referral. I called and left a vm on their line after DX code was changed and never received a call back about them not having referral I was referencing. I called [facility] And left another vm requesting a call back. Kristin Bruins can you look into this referral?

## 2017-10-21 NOTE — Telephone Encounter (Signed)
They do have the referral. The Dx code needed to be changed. It was associated with tobacco abuse but I changed it to need for colon cancer screening and sent Traci from [facility] a message. If a new referral needs to be put in, we can cancel that one and do a new one.

## 2017-11-01 ENCOUNTER — Telehealth: Payer: Self-pay | Admitting: Gastroenterology

## 2017-11-01 NOTE — Telephone Encounter (Signed)
Patient LVM and is ready to schedule his colonoscopy.

## 2017-11-04 ENCOUNTER — Telehealth: Payer: Self-pay

## 2017-11-04 ENCOUNTER — Other Ambulatory Visit: Payer: Self-pay

## 2017-11-04 DIAGNOSIS — Z1211 Encounter for screening for malignant neoplasm of colon: Secondary | ICD-10-CM

## 2017-11-04 NOTE — Telephone Encounter (Signed)
Gastroenterology Pre-Procedure Review  Request Date:  Requesting Physician: Dr.   PATIENT REVIEW QUESTIONS: The patient responded to the following health history questions as indicated:    1. Are you having any GI issues? no 2. Do you have a personal history of Polyps? no 3. Do you have a family history of Colon Cancer or Polyps? no 4. Diabetes Mellitus? no 5. Joint replacements in the past 12 months?no 6. Major health problems in the past 3 months?no 7. Any artificial heart valves, MVP, or defibrillator?no    MEDICATIONS & ALLERGIES:    Patient reports the following regarding taking any anticoagulation/antiplatelet therapy:   Plavix, Coumadin, Eliquis, Xarelto, Lovenox, Pradaxa, Brilinta, or Effient? no Aspirin? yes (ASA 81mg )  Patient confirms/reports the following medications:  Current Outpatient Medications  Medication Sig Dispense Refill  . fluticasone (FLONASE) 50 MCG/ACT nasal spray Place 2 sprays into both nostrils daily. 3 g 12  . meloxicam (MOBIC) 15 MG tablet Take 1 tablet (15 mg total) by mouth daily. 30 tablet 0  . traMADol (ULTRAM) 50 MG tablet TAKE 1-2 TABS BY MOUTH EVERY 4-6HRS AS NEEDED FOR PAIN  0   No current facility-administered medications for this visit.     Patient confirms/reports the following allergies:  No Known Allergies  No orders of the defined types were placed in this encounter.   AUTHORIZATION INFORMATION Primary Insurance: 1D#: Group #:  Secondary Insurance: 1D#: Group #:  SCHEDULE INFORMATION: Date: 11/18/17 Time: Location: [place]

## 2017-11-04 NOTE — Telephone Encounter (Signed)
Pt scheduled for a colonoscopy at [facility] on 11/18/17.

## 2017-11-18 ENCOUNTER — Ambulatory Visit: Payer: BLUE CROSS/BLUE SHIELD | Admitting: Anesthesiology

## 2017-11-18 ENCOUNTER — Encounter
Admission: RE | Disposition: A | Discharge status: Home / Self Care | Payer: Self-pay | Source: Ambulatory Visit | Attending: Gastroenterology

## 2017-11-18 ENCOUNTER — Ambulatory Visit
Admission: RE | Admit: 2017-11-18 | Discharge: 2017-11-18 | Disposition: A | Discharge status: Home / Self Care | Payer: BLUE CROSS/BLUE SHIELD | Source: Ambulatory Visit | Attending: Gastroenterology | Admitting: Gastroenterology

## 2017-11-18 DIAGNOSIS — J302 Other seasonal allergic rhinitis: Secondary | ICD-10-CM | POA: Insufficient documentation

## 2017-11-18 DIAGNOSIS — K640 First degree hemorrhoids: Secondary | ICD-10-CM

## 2017-11-18 DIAGNOSIS — Z791 Long term (current) use of non-steroidal anti-inflammatories (NSAID): Secondary | ICD-10-CM | POA: Diagnosis not present

## 2017-11-18 DIAGNOSIS — Z1211 Encounter for screening for malignant neoplasm of colon: Secondary | ICD-10-CM | POA: Diagnosis not present

## 2017-11-18 DIAGNOSIS — K621 Rectal polyp: Secondary | ICD-10-CM | POA: Insufficient documentation

## 2017-11-18 DIAGNOSIS — Z9889 Other specified postprocedural states: Secondary | ICD-10-CM | POA: Diagnosis not present

## 2017-11-18 DIAGNOSIS — Z9841 Cataract extraction status, right eye: Secondary | ICD-10-CM | POA: Diagnosis not present

## 2017-11-18 DIAGNOSIS — Z1212 Encounter for screening for malignant neoplasm of rectum: Secondary | ICD-10-CM | POA: Insufficient documentation

## 2017-11-18 DIAGNOSIS — Z818 Family history of other mental and behavioral disorders: Secondary | ICD-10-CM | POA: Diagnosis not present

## 2017-11-18 DIAGNOSIS — Z833 Family history of diabetes mellitus: Secondary | ICD-10-CM | POA: Insufficient documentation

## 2017-11-18 DIAGNOSIS — Z7951 Long term (current) use of inhaled steroids: Secondary | ICD-10-CM | POA: Diagnosis not present

## 2017-11-18 DIAGNOSIS — Z79899 Other long term (current) drug therapy: Secondary | ICD-10-CM | POA: Diagnosis not present

## 2017-11-18 DIAGNOSIS — Z9842 Cataract extraction status, left eye: Secondary | ICD-10-CM | POA: Diagnosis not present

## 2017-11-18 HISTORY — PX: COLONOSCOPY WITH PROPOFOL: SHX5780

## 2017-11-18 HISTORY — DX: Other seasonal allergic rhinitis: J30.2

## 2017-11-18 SURGERY — COLONOSCOPY WITH PROPOFOL
Anesthesia: General

## 2017-11-18 MED ORDER — SODIUM CHLORIDE 0.9 % IV SOLN
INTRAVENOUS | Status: DC
  Administered 2017-11-18 (×2): via INTRAVENOUS

## 2017-11-18 MED ORDER — FENTANYL CITRATE (PF) 100 MCG/2ML IJ SOLN
INTRAMUSCULAR | Status: DC | PRN
  Administered 2017-11-18 (×2): 50 ug via INTRAVENOUS

## 2017-11-18 MED ORDER — PHENYLEPHRINE HCL 10 MG/ML IJ SOLN
INTRAMUSCULAR | Status: DC | PRN
  Administered 2017-11-18: 100 ug via INTRAVENOUS

## 2017-11-18 MED ORDER — PROPOFOL 500 MG/50ML IV EMUL
INTRAVENOUS | Status: DC | PRN
  Administered 2017-11-18: 160 ug/kg/min via INTRAVENOUS

## 2017-11-18 MED ORDER — LIDOCAINE 2% (20 MG/ML) 5 ML SYRINGE
INTRAMUSCULAR | Status: DC | PRN
  Administered 2017-11-18: 40 mg via INTRAVENOUS

## 2017-11-18 MED ORDER — PROPOFOL 10 MG/ML IV BOLUS
INTRAVENOUS | Status: AC
Start: 2017-11-18 — End: ?
  Filled 2017-11-18: qty 20

## 2017-11-18 MED ORDER — FENTANYL CITRATE (PF) 100 MCG/2ML IJ SOLN
INTRAMUSCULAR | Status: AC
  Filled 2017-11-18: qty 2

## 2017-11-18 MED ORDER — LIDOCAINE HCL (PF) 2 % IJ SOLN
INTRAMUSCULAR | Status: AC
  Filled 2017-11-18: qty 10

## 2017-11-18 MED ORDER — PROPOFOL 10 MG/ML IV BOLUS
INTRAVENOUS | Status: DC | PRN
  Administered 2017-11-18: 100 mg via INTRAVENOUS

## 2017-11-18 MED ORDER — PROPOFOL 500 MG/50ML IV EMUL
INTRAVENOUS | Status: AC
  Filled 2017-11-18: qty 50

## 2017-11-18 NOTE — Op Note (Signed)
[facility]astroenterology Patient Name: William Richardson Procedure Date: 11/18/2017 9:04 AM MRN: [id number] Account #: [id number] Date of Birth: [birth year] Admit Type: Outpatient Age: 61 Room: [facility] ROOM 4 Gender: Male Note Status: Finalized Procedure:            Colonoscopy Indications:          Screening for colorectal malignant neoplasm Providers:            Jonathon Bellows MD, MD Referring MD:         Guadalupe Maple, MD (Referring MD) Medicines:            Monitored Anesthesia Care Complications:        No immediate complications. Procedure:            Pre-Anesthesia Assessment:                       - Prior to the procedure, a History and Physical was                        performed, and patient medications, allergies and                        sensitivities were reviewed. The patient's tolerance of                        previous anesthesia was reviewed.                       - The risks and benefits of the procedure and the                        sedation options and risks were discussed with the                        patient. All questions were answered and informed                        consent was obtained.                       - ASA Grade Assessment: I - A normal, healthy patient.                       After obtaining informed consent, the colonoscope was                        passed under direct vision. Throughout the procedure,                        the patient's blood pressure, pulse, and oxygen                        saturations were monitored continuously. The                        Colonoscope was introduced through the anus and                        advanced to the the cecum, identified by the  appendiceal orifice, IC valve and transillumination.                        The colonoscopy was performed with ease. The patient                        tolerated the procedure well. The quality of the bowel          preparation was good. Findings:      The perianal and digital rectal examinations were normal.      Non-bleeding internal hemorrhoids were found during retroflexion. The       hemorrhoids were medium-sized and Grade I (internal hemorrhoids that do       not prolapse).      A 6 mm polyp was found in the rectum. The polyp was sessile. The polyp       was removed with a cold snare. Resection and retrieval were complete.      The exam was otherwise without abnormality on direct and retroflexion       views. Impression:           - Non-bleeding internal hemorrhoids.                       - One 6 mm polyp in the rectum, removed with a cold                        snare. Resected and retrieved.                       - The examination was otherwise normal on direct and                        retroflexion views. Recommendation:       - Discharge patient to home (with escort).                       - Resume previous diet.                       - Continue present medications.                       - Await pathology results.                       - Repeat colonoscopy in 5-10 years for surveillance                        based on pathology results. Procedure Code(s):    --- Professional ---                       [phone], Colonoscopy, flexible; with removal of tumor(s),                        polyp(s), or other lesion(s) by snare technique Diagnosis Code(s):    --- Professional ---                       Z12.11, Encounter for screening for malignant neoplasm                        of  colon                       K62.1, Rectal polyp                       K64.0, First degree hemorrhoids CPT copyright 2016 American Medical Association. All rights reserved. The codes documented in this report are preliminary and upon coder review may  be revised to meet current compliance requirements. Jonathon Bellows, MD Jonathon Bellows MD, MD 11/18/2017 9:36:49 AM This report has been signed electronically. Number of  Addenda: 0 Note Initiated On: 11/18/2017 9:04 AM Scope Withdrawal Time: 0 hours 16 minutes 3 seconds  Total Procedure Duration: 0 hours 22 minutes 57 seconds       [facility]

## 2017-11-18 NOTE — Anesthesia Preprocedure Evaluation (Signed)
Anesthesia Evaluation  Patient identified by MRN, date of birth, ID band Patient awake    Reviewed: Allergy & Precautions, NPO status , Patient's Chart, lab work & pertinent test results  History of Anesthesia Complications Negative for: history of anesthetic complications  Airway Mallampati: II       Dental   Pulmonary neg sleep apnea, neg COPD,           Cardiovascular (-) hypertension(-) Past MI and (-) CHF (-) dysrhythmias (-) Valvular Problems/Murmurs     Neuro/Psych neg Seizures    GI/Hepatic Neg liver ROS, neg GERD  ,  Endo/Other  neg diabetes  Renal/GU negative Renal ROS     Musculoskeletal   Abdominal   Peds  Hematology   Anesthesia Other Findings   Reproductive/Obstetrics                             Anesthesia Physical Anesthesia Plan  ASA: I  Anesthesia Plan: General   Post-op Pain Management:    Induction: Intravenous  PONV Risk Score and Plan: 2 and TIVA and Propofol infusion  Airway Management Planned: Nasal Cannula  Additional Equipment:   Intra-op Plan:   Post-operative Plan:   Informed Consent: I have reviewed the patients History and Physical, chart, labs and discussed the procedure including the risks, benefits and alternatives for the proposed anesthesia with the patient or authorized representative who has indicated his/her understanding and acceptance.     Plan Discussed with:   Anesthesia Plan Comments:         Anesthesia Quick Evaluation

## 2017-11-18 NOTE — H&P (Signed)
     Jonathon Bellows, MD [address], [place], [place], [place], [address] [address], [place], [place], [place], [address] Phone: [phone]  Fax: [phone]  Primary Care Physician:  Guadalupe Maple, MD   Pre-Procedure History & Physical: HPI:  William Richardson. is a 60 y.o. male is here for an colonoscopy.   Past Medical History:  Diagnosis Date  . Hypogonadism in male   . Seasonal allergies     Past Surgical History:  Procedure Laterality Date  . EYE SURGERY Bilateral    cataract  . ROTATOR CUFF REPAIR    . SPINE SURGERY      Prior to Admission medications   Medication Sig Start Date End Date Taking? Authorizing Provider  fluticasone (FLONASE) 50 MCG/ACT nasal spray Place 2 sprays into both nostrils daily. 10/01/15  Yes Crissman, Jeannette How, MD  traMADol (ULTRAM) 50 MG tablet TAKE 1-2 TABS BY MOUTH EVERY 4-6HRS AS NEEDED FOR PAIN 09/05/17  Yes [provider]  meloxicam (MOBIC) 15 MG tablet Take 1 tablet (15 mg total) by mouth daily. 05/12/17   Frederich Cha, MD    Allergies as of 11/04/2017  . (No Known Allergies)    Family History  Problem Relation Age of Onset  . Cancer Mother   . Dementia Mother   . Diabetes Brother     Social History   Socioeconomic History  . Marital status: Married    Spouse name: Not on file  . Number of children: Not on file  . Years of education: Not on file  . Highest education level: Not on file  Social Needs  . Financial resource strain: Not on file  . Food insecurity - worry: Not on file  . Food insecurity - inability: Not on file  . Transportation needs - medical: Not on file  . Transportation needs - non-medical: Not on file  Occupational History  . Not on file  Tobacco Use  . Smoking status: Never Smoker  . Smokeless tobacco: Never Used  Substance and Sexual Activity  . Alcohol use: No  . Drug use: No  . Sexual activity: Not on file  Other Topics Concern  . Not on file  Social History  Narrative  . Not on file    Review of Systems: See HPI, otherwise negative ROS  Physical Exam: BP 116/83   Pulse 72   Temp (!) 96.8 F (36 C) (Oral)   Ht 5\' 10"  (1.778 m)   Wt 185 lb (83.9 kg)   SpO2 100%   BMI 26.54 kg/m  General:   Alert,  pleasant and cooperative in NAD Head:  Normocephalic and atraumatic. Neck:  Supple; no masses or thyromegaly. Lungs:  Clear throughout to auscultation, normal respiratory effort.    Heart:  +S1, +S2, Regular rate and rhythm, No edema. Abdomen:  Soft, nontender and nondistended. Normal bowel sounds, without guarding, and without rebound.   Neurologic:  Alert and  oriented x4;  grossly normal neurologically.  Impression/Plan: [name] is here for an colonoscopy to be performed for Screening colonoscopy average risk   Risks, benefits, limitations, and alternatives regarding  colonoscopy have been reviewed with the patient.  Questions have been answered.  All parties agreeable.   Jonathon Bellows, MD  11/18/2017, 9:00 AM

## 2017-11-18 NOTE — Transfer of Care (Signed)
Immediate Anesthesia Transfer of Care Note  Patient: William Richardson.  Procedure(s) Performed: COLONOSCOPY WITH PROPOFOL (N/A )  Patient Location: PACU and Endoscopy Unit  Anesthesia Type:General  Level of Consciousness: sedated  Airway & Oxygen Therapy: Patient Spontanous Breathing and Patient connected to nasal cannula oxygen  Post-op Assessment: Report given to RN and Post -op Vital signs reviewed and stable  Post vital signs: Reviewed and stable  Last Vitals:  Vitals:   11/18/17 0842 11/18/17 0900  BP: 116/83   Pulse: 72 (P) 67  Temp: (!) 36 C (!) (P) 35.8 C  SpO2: 100% (P) 100%    Last Pain:  Vitals:   11/18/17 0900  TempSrc: (P) Tympanic         Complications: No apparent anesthesia complications

## 2017-11-18 NOTE — Anesthesia Postprocedure Evaluation (Signed)
Anesthesia Post Note  Patient: William Richardson.  Procedure(s) Performed: COLONOSCOPY WITH PROPOFOL (N/A )  Patient location during evaluation: Endoscopy Anesthesia Type: General Level of consciousness: awake and alert Pain management: pain level controlled Vital Signs Assessment: post-procedure vital signs reviewed and stable Respiratory status: spontaneous breathing and respiratory function stable Cardiovascular status: stable Anesthetic complications: no     Last Vitals:  Vitals:   11/18/17 1000 11/18/17 1010  BP: 105/73 102/85  Pulse: 64 (!) 58  Resp: 13 13  Temp:    SpO2: 100% 99%    Last Pain:  Vitals:   11/18/17 0940  TempSrc: Tympanic                 Keiaira Donlan,Colson K

## 2017-11-18 NOTE — Anesthesia Post-op Follow-up Note (Signed)
Anesthesia QCDR form completed.        

## 2017-11-21 ENCOUNTER — Encounter: Payer: Self-pay | Admitting: Gastroenterology

## 2017-11-22 ENCOUNTER — Encounter: Payer: Self-pay | Admitting: Gastroenterology

## 2017-12-01 LAB — SURGICAL PATHOLOGY

## 2017-12-08 IMAGING — CR DG SHOULDER 2+V*L*
3 series · 3 of 3 positions shown · non-contrast
Comparison: Chest CT 12/08/2008

CLINICAL DATA: 60-year-old male status post blunt trauma to the
left shoulder while lifting overhead yesterday. Pain and decreased
range of motion.

EXAM:
LEFT SHOULDER - 2+ VIEW

[shoulder grashey]
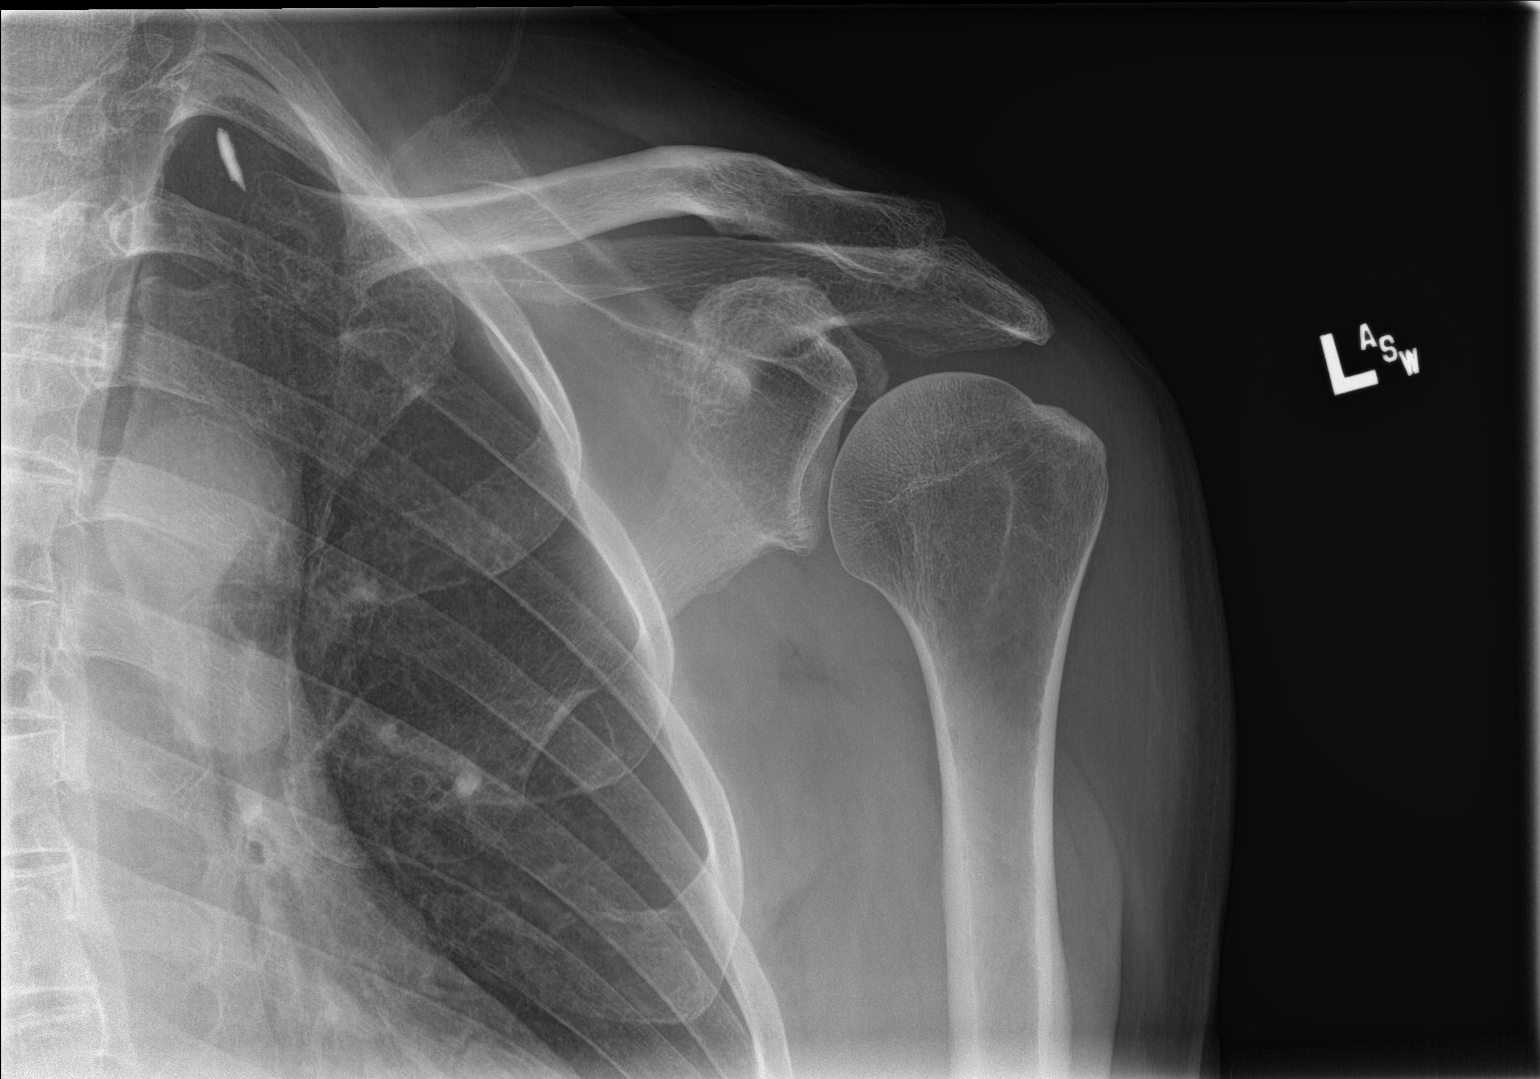

[shoulder y view]
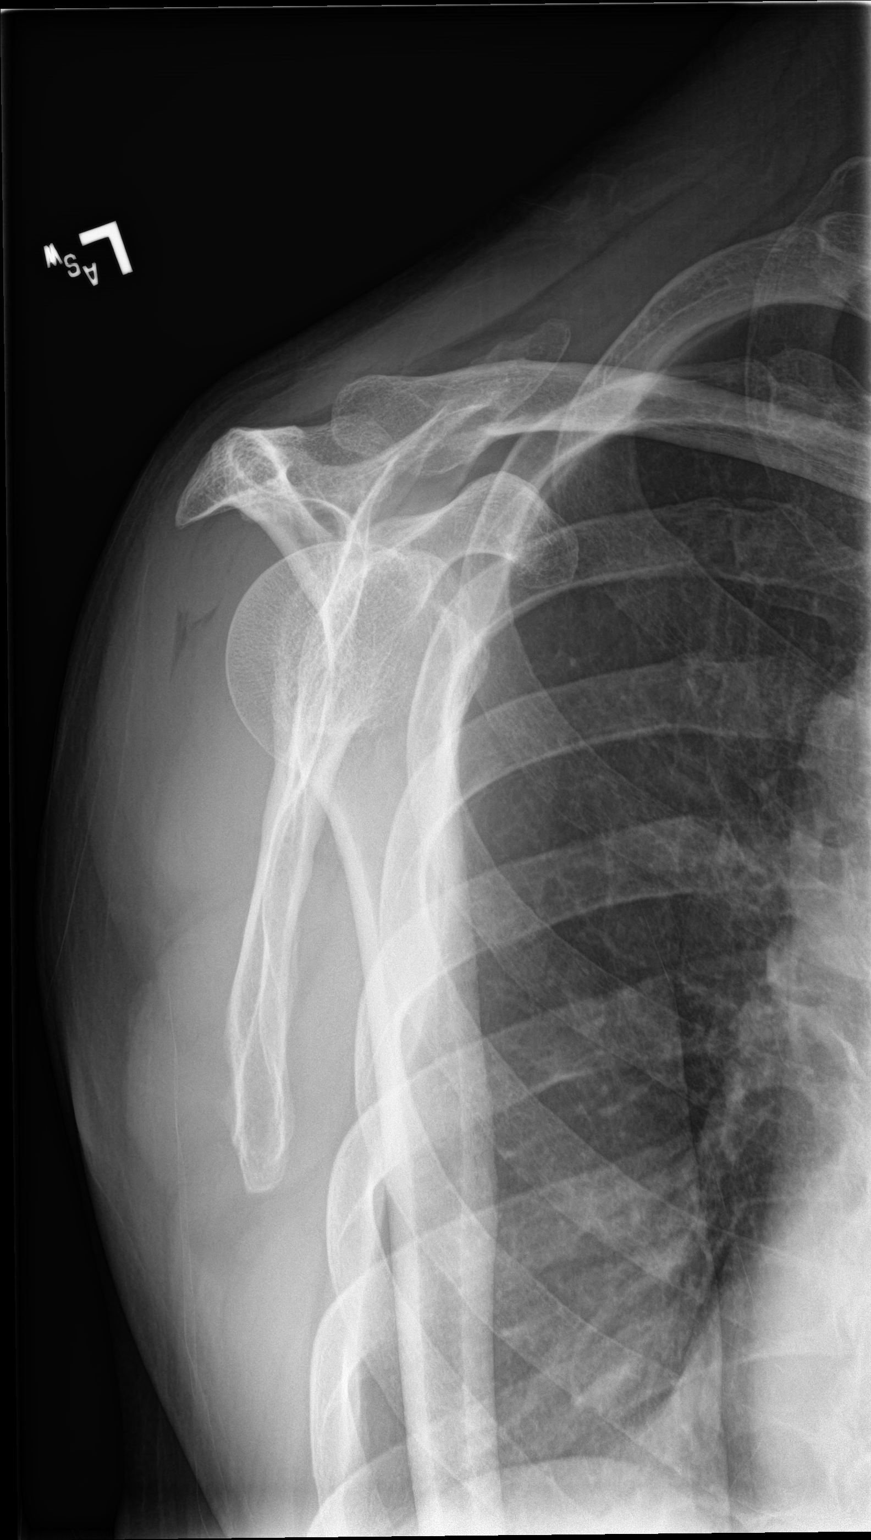

[shoulder axial]
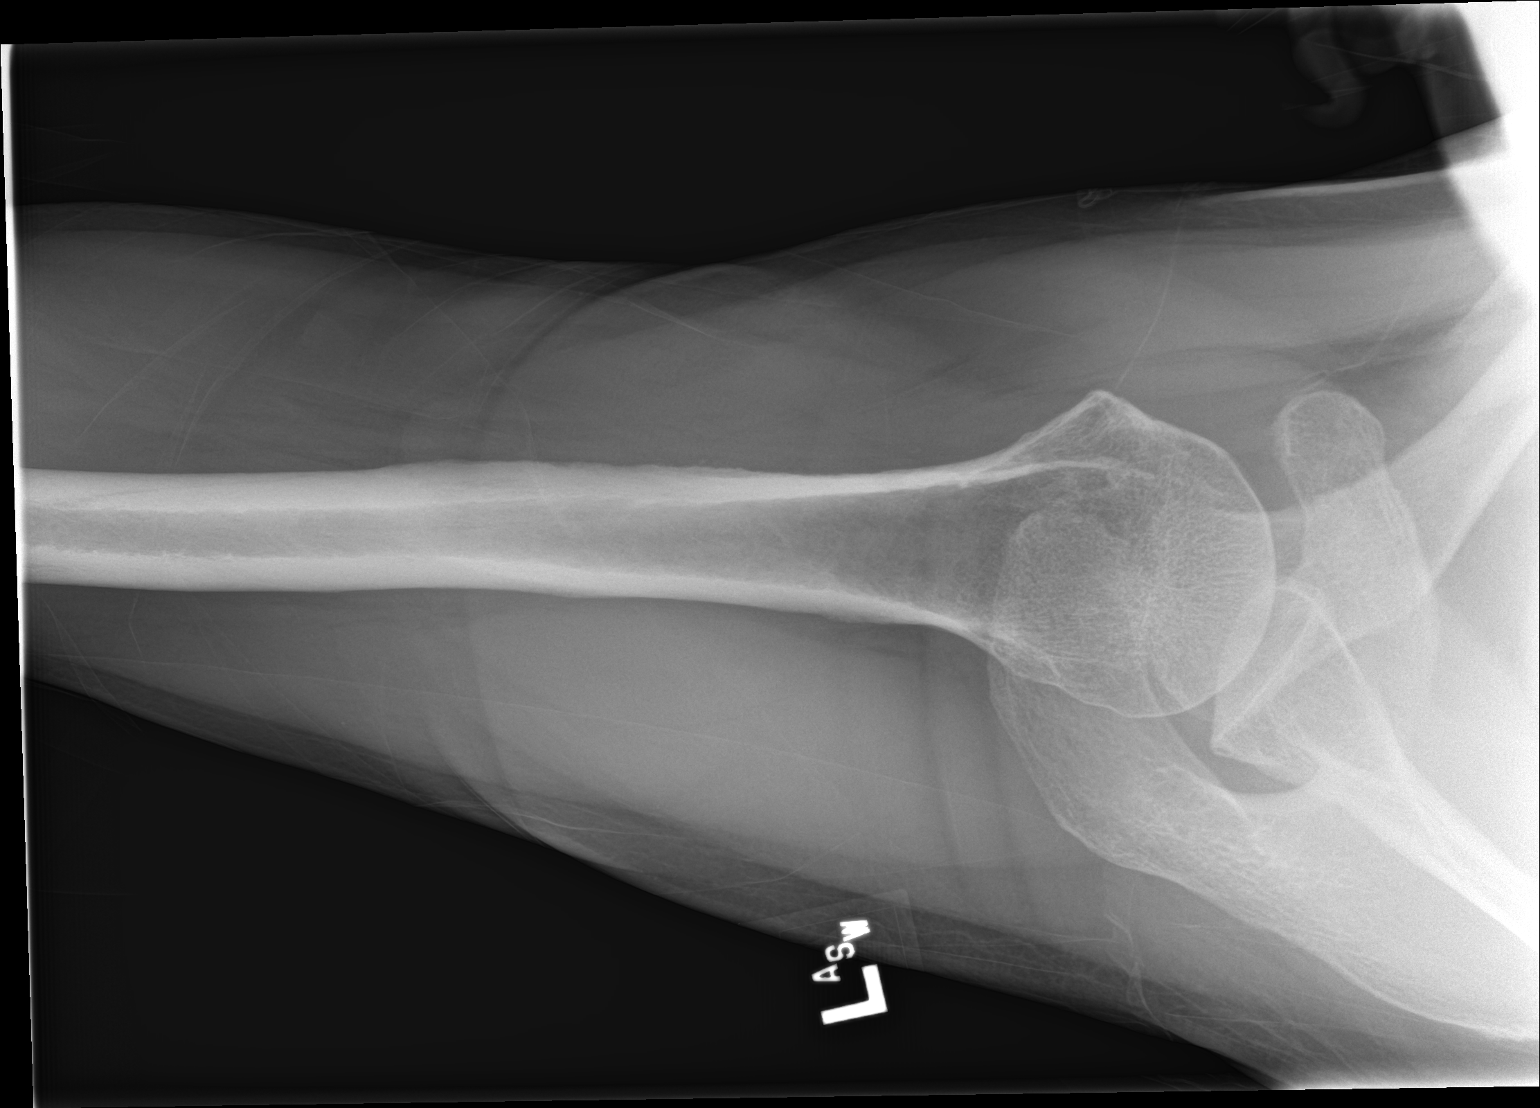

[3 of 3 positions shown; findings below may reference images not displayed]

FINDINGS: No glenohumeral joint dislocation. Bone mineralization is within
normal limits for age. Intact proximal left humerus. Left clavicle
and scapula appear intact and within normal limits. Negative visible
left ribs and lung parenchyma.
IMPRESSION: Normal for age radiographic appearance of the left shoulder.

## 2018-02-16 ENCOUNTER — Encounter: Payer: Self-pay | Admitting: Family Medicine

## 2018-04-07 ENCOUNTER — Ambulatory Visit (INDEPENDENT_AMBULATORY_CARE_PROVIDER_SITE_OTHER): Payer: BLUE CROSS/BLUE SHIELD | Admitting: Family Medicine

## 2018-04-07 ENCOUNTER — Ambulatory Visit: Payer: Self-pay

## 2018-04-07 VITALS — BP 130/84 | HR 87 | Temp 98.5°F | Resp 17 | Wt 189.6 lb

## 2018-04-07 DIAGNOSIS — J069 Acute upper respiratory infection, unspecified: Secondary | ICD-10-CM | POA: Diagnosis not present

## 2018-04-07 MED ORDER — AZITHROMYCIN 250 MG PO TABS: ORAL_TABLET | ORAL | 0 refills | Status: DC

## 2018-04-07 MED ORDER — PREDNISONE 20 MG PO TABS: 40.0000 mg | ORAL_TABLET | Freq: Every day | ORAL | 0 refills | Status: DC

## 2018-04-07 MED ORDER — BENZONATATE 200 MG PO CAPS: 200.0000 mg | ORAL_CAPSULE | Freq: Three times a day (TID) | ORAL | 0 refills | Status: DC | PRN

## 2018-04-07 NOTE — Progress Notes (Signed)
BP 130/84 (BP Location: Right Arm, Patient Position: Sitting)   Pulse 87   Temp 98.5 F (36.9 C) (Oral)   Resp 17   Wt 189 lb 9.6 oz (86 kg)   SpO2 98%   BMI 27.20 kg/m    Subjective:    Patient ID: William Numbers., male    DOB: 11-16-1956, 62 y.o.   MRN: [id number]  HPI: William Linch. is a 62 y.o. male  Chief Complaint  Patient presents with  . Cough    x2-3 weeks   . Sinus Problem    x2-3 weeks    Pt here today with 2-3 weeks of persistent, worsening productive cough, facial pain and pressure, headaches, fatigue, and congestion. Denies fevers, chills, sweats, body aches, CP, SOB. Trying some nyquil at night and mucinex during the day with no relief. Several sick family members when this all started. Nonsmoker, no hx of pulmonary dz known.   Relevant past medical, surgical, family and social history reviewed and updated as indicated. Interim medical history since our last visit reviewed. Allergies and medications reviewed and updated.  Review of Systems  Per HPI unless specifically indicated above     Objective:    BP 130/84 (BP Location: Right Arm, Patient Position: Sitting)   Pulse 87   Temp 98.5 F (36.9 C) (Oral)   Resp 17   Wt 189 lb 9.6 oz (86 kg)   SpO2 98%   BMI 27.20 kg/m   Wt Readings from Last 3 Encounters:  04/07/18 189 lb 9.6 oz (86 kg)  11/18/17 185 lb (83.9 kg)  10/13/17 189 lb (85.7 kg)    Physical Exam  Constitutional: He is oriented to person, place, and time. He appears well-developed and well-nourished. No distress.  HENT:  Head: Atraumatic.  Right Ear: External ear normal.  Left Ear: External ear normal.  Oropharynx and nasal mucosa erythematous and edematous, with thick drainage present  Eyes: Pupils are equal, round, and reactive to light. Conjunctivae and EOM are normal.  Neck: Normal range of motion. Neck supple.  Cardiovascular: Normal rate, regular rhythm and normal heart sounds.  Pulmonary/Chest:  Effort normal. He has wheezes (mild).  Musculoskeletal: Normal range of motion.  Lymphadenopathy:    He has no cervical adenopathy.  Neurological: He is alert and oriented to person, place, and time.  Skin: Skin is warm and dry.  Psychiatric: He has a normal mood and affect. His behavior is normal.  Nursing note and vitals reviewed.   Results for orders placed or performed during the hospital encounter of 11/18/17  Surgical pathology  Result Value Ref Range   SURGICAL PATHOLOGY      Surgical Pathology CASE: [id number] PATIENT: Particia Nearing Surgical Pathology Report     SPECIMEN SUBMITTED: A. Rectum polyp; cold snare  CLINICAL HISTORY:   PRE-OPERATIVE DIAGNOSIS: Screening Z12.11  POST-OPERATIVE DIAGNOSIS: Colon polyp, internal hemorrhoids     DIAGNOSIS: A.  RECTUM POLYP; COLD SNARE: - HYPERPLASTIC POLYP. - NEGATIVE FOR DYSPLASIA AND MALIGNANCY.   GROSS DESCRIPTION:  A. Labeled: rectum polyp cold snare  Tissue fragment(s): multiple  Size: aggregate, 0.4 x 0.2 x 0.1 cm  Description: pink fragment and fecal material  Entirely submitted in 1 cassette(s).    Final Diagnosis performed by Delorse Lek, MD.  Electronically signed 11/21/2017 1:56:55PM    The electronic signature indicates that the named Attending Pathologist has evaluated the specimen  Technical component performed at [facility], [address], [place], [place] [address] Lab: [phone] Dir:  Rush Farmer, MD, MMM  Professional component performed at [facility], [facility], [place], [place],  [address] Lab: [phone] Dir: Dellia Nims. Reuel Derby, MD        Assessment & Plan:   Problem List Items Addressed This Visit    None    Visit Diagnoses    Upper respiratory tract infection, unspecified type    -  Primary   Will tx with zpack, prednisone, and tessalon perles. Supportive care reviewed, f/u if worsening or no improvement   Relevant Medications     azithromycin (ZITHROMAX) 250 MG tablet       Follow up plan: Return if symptoms worsen or fail to improve.

## 2018-04-07 NOTE — Telephone Encounter (Signed)
Pt. Reports he has a cough x 2 weeks. Has tried OTC Mucinex and Nyquil. Not helping. Has "stuffy nose and sore throat." Thinks he has a fever, but does not have a thermometer. Appointment made for today. Reason for Disposition . [1] Continuous (nonstop) coughing interferes with work or school AND [2] no improvement using cough treatment per protocol  Answer Assessment - Initial Assessment Questions 1. ONSET: "When did the cough begin?"      2 weeks 2. SEVERITY: "How bad is the cough today?"      Moderate 3. RESPIRATORY DISTRESS: "Describe your breathing."      No 4. FEVER: "Do you have a fever?" If so, ask: "What is your temperature, how was it measured, and when did it start?"     Yes 5. HEMOPTYSIS: "Are you coughing up any blood?" If so ask: "How much?" (flecks, streaks, tablespoons, etc.)     No 6. TREATMENT: "What have you done so far to treat the cough?" (e.g., meds, fluids, humidifier)     OTC Nyquil and Mucinex 7. CARDIAC HISTORY: "Do you have any history of heart disease?" (e.g., heart attack, congestive heart failure)      No 8. LUNG HISTORY: "Do you have any history of lung disease?"  (e.g., pulmonary embolus, asthma, emphysema)     No 9. PE RISK FACTORS: "Do you have a history of blood clots?" (or: recent major surgery, recent prolonged travel, bedridden )     No 10. OTHER SYMPTOMS: "Do you have any other symptoms? (e.g., runny nose, wheezing, chest pain)       Stuffed nose, sore throat 11. PREGNANCY: "Is there any chance you are pregnant?" "When was your last menstrual period?"       No 12. TRAVEL: "Have you traveled out of the country in the last month?" (e.g., travel history, exposures)       No  Protocols used: COUGH - ACUTE NON-PRODUCTIVE-A-AH

## 2018-04-10 NOTE — Patient Instructions (Signed)
Follow up as needed

## 2018-06-14 ENCOUNTER — Encounter: Payer: BLUE CROSS/BLUE SHIELD | Admitting: Family Medicine

## 2018-07-03 ENCOUNTER — Encounter: Payer: BLUE CROSS/BLUE SHIELD | Admitting: Family Medicine

## 2018-07-10 ENCOUNTER — Encounter: Payer: BLUE CROSS/BLUE SHIELD | Admitting: Family Medicine

## 2018-09-18 ENCOUNTER — Encounter: Payer: Self-pay | Admitting: Family Medicine

## 2019-01-22 ENCOUNTER — Ambulatory Visit (INDEPENDENT_AMBULATORY_CARE_PROVIDER_SITE_OTHER): Payer: BLUE CROSS/BLUE SHIELD | Admitting: Family Medicine

## 2019-01-22 ENCOUNTER — Other Ambulatory Visit: Payer: Self-pay

## 2019-01-22 ENCOUNTER — Encounter: Payer: Self-pay | Admitting: Family Medicine

## 2019-01-22 VITALS — BP 127/84 | HR 63 | Temp 98.7°F | Ht 70.1 in | Wt 181.2 lb

## 2019-01-22 DIAGNOSIS — Z1159 Encounter for screening for other viral diseases: Secondary | ICD-10-CM | POA: Diagnosis not present

## 2019-01-22 DIAGNOSIS — E291 Testicular hypofunction: Secondary | ICD-10-CM | POA: Diagnosis not present

## 2019-01-22 DIAGNOSIS — Z114 Encounter for screening for human immunodeficiency virus [HIV]: Secondary | ICD-10-CM

## 2019-01-22 DIAGNOSIS — Z0000 Encounter for general adult medical examination without abnormal findings: Secondary | ICD-10-CM | POA: Diagnosis not present

## 2019-01-22 LAB — UA/M W/RFLX CULTURE, ROUTINE
Bilirubin, UA: NEGATIVE
GLUCOSE, UA: NEGATIVE
Ketones, UA: NEGATIVE
LEUKOCYTES UA: NEGATIVE
Nitrite, UA: NEGATIVE
PROTEIN UA: NEGATIVE
RBC, UA: NEGATIVE
SPEC GRAV UA: 1.025 (ref 1.005–1.030)
Urobilinogen, Ur: 0.2 mg/dL (ref 0.2–1.0)
pH, UA: 5.5 (ref 5.0–7.5)

## 2019-01-22 MED ORDER — MELOXICAM 15 MG PO TABS: 15.0000 mg | ORAL_TABLET | Freq: Every day | ORAL | 5 refills | Status: DC

## 2019-01-22 NOTE — Progress Notes (Signed)
BP 127/84 (BP Location: Right Arm, Patient Position: Sitting, Cuff Size: Normal)   Pulse 63   Temp 98.7 F (37.1 C)   Ht 5' 10.1" (1.781 m)   Wt 181 lb 3 oz (82.2 kg)   SpO2 98%   BMI 25.92 kg/m    Subjective:    Patient ID: William Numbers., male    DOB: 13-Jan-1956, 63 y.o.   MRN: [id number]  HPI: William Rolfe. is a 63 y.o. male presenting on 01/22/2019 for comprehensive medical examination. Current medical complaints include:none  He currently lives with: Interim Problems from his last visit: no  Depression Screen done today and results listed below:  Depression screen [facility] 2/9 01/22/2019 10/13/2017 10/07/2016  Decreased Interest 0 0 0  Down, Depressed, Hopeless 0 0 0  PHQ - 2 Score 0 0 0  Altered sleeping 0 - 0  Tired, decreased energy 0 - 0  Change in appetite 0 - 0  Feeling bad or failure about yourself  0 - 0  Trouble concentrating 0 - 0  Moving slowly or fidgety/restless 0 - 0  Suicidal thoughts 0 - 0  PHQ-9 Score 0 - 0  Difficult doing work/chores Not difficult at all - -    The patient does not have a history of falls. I did not complete a risk assessment for falls. A plan of care for falls was not documented.   Past Medical History:  Past Medical History:  Diagnosis Date  . Hypogonadism in male   . Seasonal allergies     Surgical History:  Past Surgical History:  Procedure Laterality Date  . COLONOSCOPY WITH PROPOFOL N/A 11/18/2017   Procedure: COLONOSCOPY WITH PROPOFOL;  Surgeon: Jonathon Bellows, MD;  Location: [facility] ENDOSCOPY;  Service: Gastroenterology;  Laterality: N/A;  . EYE SURGERY Bilateral    cataract  . ROTATOR CUFF REPAIR    . SPINE SURGERY      Medications:  Current Outpatient Medications on File Prior to Visit  Medication Sig  . fluticasone (FLONASE) 50 MCG/ACT nasal spray Place 2 sprays into both nostrils daily.  . traMADol (ULTRAM) 50 MG tablet TAKE 1-2 TABS BY MOUTH EVERY 4-6HRS AS NEEDED FOR PAIN   No current  facility-administered medications on file prior to visit.     Allergies:  No Known Allergies  Social History:  Social History   Socioeconomic History  . Marital status: Married    Spouse name: Not on file  . Number of children: Not on file  . Years of education: Not on file  . Highest education level: Not on file  Occupational History  . Not on file  Social Needs  . Financial resource strain: Not on file  . Food insecurity:    Worry: Not on file    Inability: Not on file  . Transportation needs:    Medical: Not on file    Non-medical: Not on file  Tobacco Use  . Smoking status: Never Smoker  . Smokeless tobacco: Never Used  Substance and Sexual Activity  . Alcohol use: No  . Drug use: No  . Sexual activity: Not on file  Lifestyle  . Physical activity:    Days per week: Not on file    Minutes per session: Not on file  . Stress: Not on file  Relationships  . Social connections:    Talks on phone: Not on file    Gets together: Not on file    Attends religious service: Not on file  Active member of club or organization: Not on file    Attends meetings of clubs or organizations: Not on file    Relationship status: Not on file  . Intimate partner violence:    Fear of current or ex partner: Not on file    Emotionally abused: Not on file    Physically abused: Not on file    Forced sexual activity: Not on file  Other Topics Concern  . Not on file  Social History Narrative  . Not on file   Social History   Tobacco Use  Smoking Status Never Smoker  Smokeless Tobacco Never Used   Social History   Substance and Sexual Activity  Alcohol Use No    Family History:  Family History  Problem Relation Age of Onset  . Cancer Mother   . Dementia Mother   . Diabetes Brother     Past medical history, surgical history, medications, allergies, family history and social history reviewed with patient today and changes made to appropriate areas of the chart.   Review  of Systems - General ROS: negative Psychological ROS: negative Ophthalmic ROS: negative ENT ROS: negative Allergy and Immunology ROS: negative Hematological and Lymphatic ROS: negative Endocrine ROS: negative Breast ROS: negative for breast lumps Respiratory ROS: no cough, shortness of breath, or wheezing Cardiovascular ROS: no chest pain or dyspnea on exertion Gastrointestinal ROS: no abdominal pain, change in bowel habits, or black or bloody stools Genito-Urinary ROS: no dysuria, trouble voiding, or hematuria Musculoskeletal ROS: negative Neurological ROS: no TIA or stroke symptoms Dermatological ROS: negative All other ROS negative except what is listed above and in the HPI.      Objective:    BP 127/84 (BP Location: Right Arm, Patient Position: Sitting, Cuff Size: Normal)   Pulse 63   Temp 98.7 F (37.1 C)   Ht 5' 10.1" (1.781 m)   Wt 181 lb 3 oz (82.2 kg)   SpO2 98%   BMI 25.92 kg/m   Wt Readings from Last 3 Encounters:  01/22/19 181 lb 3 oz (82.2 kg)  04/07/18 189 lb 9.6 oz (86 kg)  11/18/17 185 lb (83.9 kg)    Physical Exam Vitals signs and nursing note reviewed.  Constitutional:      General: He is not in acute distress.    Appearance: He is well-developed.  HENT:     Head: Atraumatic.     Right Ear: External ear normal.     Left Ear: External ear normal.     Nose: Nose normal.  Eyes:     General: No scleral icterus.    Conjunctiva/sclera: Conjunctivae normal.     Pupils: Pupils are equal, round, and reactive to light.  Neck:     Musculoskeletal: Normal range of motion and neck supple.  Cardiovascular:     Rate and Rhythm: Normal rate and regular rhythm.     Heart sounds: Normal heart sounds. No murmur.  Pulmonary:     Effort: Pulmonary effort is normal. No respiratory distress.     Breath sounds: Normal breath sounds.  Abdominal:     General: Bowel sounds are normal. There is no distension.     Palpations: Abdomen is soft. There is no mass.      Tenderness: There is no abdominal tenderness. There is no guarding.  Genitourinary:    Prostate: Normal.     Rectum: Normal.  Musculoskeletal: Normal range of motion.        General: No tenderness.  Skin:  General: Skin is warm and dry.     Findings: No rash.  Neurological:     Mental Status: He is alert and oriented to person, place, and time.     Deep Tendon Reflexes: Reflexes are normal and symmetric.  Psychiatric:        Behavior: Behavior normal.     Results for orders placed or performed during the hospital encounter of 11/18/17  Surgical pathology  Result Value Ref Range   SURGICAL PATHOLOGY      Surgical Pathology CASE: [id number] PATIENT: Particia Nearing Surgical Pathology Report     SPECIMEN SUBMITTED: A. Rectum polyp; cold snare  CLINICAL HISTORY:   PRE-OPERATIVE DIAGNOSIS: Screening Z12.11  POST-OPERATIVE DIAGNOSIS: Colon polyp, internal hemorrhoids     DIAGNOSIS: A.  RECTUM POLYP; COLD SNARE: - HYPERPLASTIC POLYP. - NEGATIVE FOR DYSPLASIA AND MALIGNANCY.   GROSS DESCRIPTION:  A. Labeled: rectum polyp cold snare  Tissue fragment(s): multiple  Size: aggregate, 0.4 x 0.2 x 0.1 cm  Description: pink fragment and fecal material  Entirely submitted in 1 cassette(s).    Final Diagnosis performed by Delorse Lek, MD.  Electronically signed 11/21/2017 1:56:55PM    The electronic signature indicates that the named Attending Pathologist has evaluated the specimen  Technical component performed at [facility], [address] [place], [place] [address] Lab: [phone] Dir: Rush Farmer, MD, MMM  Professional component performed at [facility], [facility], [place], [place], [place] [address] Lab: [phone] Dir: Dellia Nims. Reuel Derby, MD        Assessment & Plan:   Problem List Items Addressed This Visit      Endocrine   Hypogonadism in male   Relevant Orders   PSA    Other Visit Diagnoses    Annual  physical exam    -  Primary   Relevant Orders   CBC with Differential/Platelet   Comprehensive metabolic panel   Lipid Panel w/o Chol/HDL Ratio   TSH   UA/M w/rflx Culture, Routine   Encounter for screening for HIV       Relevant Orders   HIV Antibody (routine testing w rflx)   Need for hepatitis C screening test       Relevant Orders   Hepatitis C antibody       Discussed aspirin prophylaxis for myocardial infarction prevention and decision was it was not indicated  LABORATORY TESTING:  Health maintenance labs ordered today as discussed above.   The natural history of prostate cancer and ongoing controversy regarding screening and potential treatment outcomes of prostate cancer has been discussed with the patient. The meaning of a false positive PSA and a false negative PSA has been discussed. He indicates understanding of the limitations of this screening test and wishes to proceed with screening PSA testing.   IMMUNIZATIONS:   - Tdap: Tetanus vaccination status reviewed: last tetanus booster within 10 years. - Influenza: Up to date - Pneumovax: Not applicable - Prevnar: Not applicable - HPV: Not applicable - Zostavax vaccine: Up to date  SCREENING: - Colonoscopy: Up to date  Discussed with patient purpose of the colonoscopy is to detect colon cancer at curable precancerous or early stages   PATIENT COUNSELING:    Sexuality: Discussed sexually transmitted diseases, partner selection, use of condoms, avoidance of unintended pregnancy  and contraceptive alternatives.   Advised to avoid cigarette smoking.  I discussed with the patient that most people either abstain from alcohol or drink within safe limits (<=14/week and <=4 drinks/occasion for males, <=7/weeks  and <= 3 drinks/occasion for females) and that the risk for alcohol disorders and other health effects rises proportionally with the number of drinks per week and how often a drinker exceeds daily limits.  Discussed  cessation/primary prevention of drug use and availability of treatment for abuse.   Diet: Encouraged to adjust caloric intake to maintain  or achieve ideal body weight, to reduce intake of dietary saturated fat and total fat, to limit sodium intake by avoiding high sodium foods and not adding table salt, and to maintain adequate dietary potassium and calcium preferably from fresh fruits, vegetables, and low-fat dairy products.    stressed the importance of regular exercise  Injury prevention: Discussed safety belts, safety helmets, smoke detector, smoking near bedding or upholstery.   Dental health: Discussed importance of regular tooth brushing, flossing, and dental visits.   Follow up plan: NEXT PREVENTATIVE PHYSICAL DUE IN 1 YEAR. Return in about 1 year (around 01/23/2020) for CPE.

## 2019-01-23 ENCOUNTER — Encounter: Payer: Self-pay | Admitting: Family Medicine

## 2019-01-23 LAB — COMPREHENSIVE METABOLIC PANEL
ALT: 17 IU/L (ref 0–44)
AST: 16 IU/L (ref 0–40)
Albumin/Globulin Ratio: 2.5 — ABNORMAL HIGH (ref 1.2–2.2)
Albumin: 5 g/dL — ABNORMAL HIGH (ref 3.8–4.8)
Alkaline Phosphatase: 87 IU/L (ref 39–117)
BILIRUBIN TOTAL: 0.7 mg/dL (ref 0.0–1.2)
BUN/Creatinine Ratio: 10 (ref 10–24)
BUN: 11 mg/dL (ref 8–27)
CALCIUM: 10.1 mg/dL (ref 8.6–10.2)
CHLORIDE: 101 mmol/L (ref 96–106)
CO2: 25 mmol/L (ref 20–29)
Creatinine, Ser: 1.14 mg/dL (ref 0.76–1.27)
GFR calc non Af Amer: 68 mL/min/{1.73_m2} (ref >59)
GFR, EST AFRICAN AMERICAN: 78 mL/min/{1.73_m2} (ref >59)
Globulin, Total: 2 g/dL (ref 1.5–4.5)
Glucose: 104 mg/dL — ABNORMAL HIGH (ref 65–99)
Potassium: 4.6 mmol/L (ref 3.5–5.2)
Sodium: 144 mmol/L (ref 134–144)
TOTAL PROTEIN: 7 g/dL (ref 6.0–8.5)

## 2019-01-23 LAB — TSH: TSH: 1.83 u[IU]/mL (ref 0.450–4.500)

## 2019-01-23 LAB — CBC WITH DIFFERENTIAL/PLATELET
BASOS: 1 %
Basophils Absolute: 0.1 10*3/uL (ref 0.0–0.2)
EOS (ABSOLUTE): 0.1 10*3/uL (ref 0.0–0.4)
EOS: 1 %
HEMOGLOBIN: 16.8 g/dL (ref 13.0–17.7)
Hematocrit: 49.6 % (ref 37.5–51.0)
IMMATURE GRANULOCYTES: 0 %
Immature Grans (Abs): 0 10*3/uL (ref 0.0–0.1)
Lymphocytes Absolute: 1.2 10*3/uL (ref 0.7–3.1)
Lymphs: 18 %
MCH: 30.7 pg (ref 26.6–33.0)
MCHC: 33.9 g/dL (ref 31.5–35.7)
MCV: 91 fL (ref 79–97)
MONOCYTES: 9 %
Monocytes Absolute: 0.6 10*3/uL (ref 0.1–0.9)
NEUTROS PCT: 71 %
Neutrophils Absolute: 4.6 10*3/uL (ref 1.4–7.0)
PLATELETS: 235 10*3/uL (ref 150–450)
RBC: 5.47 x10E6/uL (ref 4.14–5.80)
RDW: 13.1 % (ref 11.6–15.4)
WBC: 6.5 10*3/uL (ref 3.4–10.8)

## 2019-01-23 LAB — LIPID PANEL W/O CHOL/HDL RATIO
Cholesterol, Total: 194 mg/dL (ref 100–199)
HDL: 60 mg/dL (ref >39)
LDL Calculated: 117 mg/dL — ABNORMAL HIGH (ref 0–99)
Triglycerides: 84 mg/dL (ref 0–149)
VLDL CHOLESTEROL CAL: 17 mg/dL (ref 5–40)

## 2019-01-23 LAB — HIV ANTIBODY (ROUTINE TESTING W REFLEX): HIV SCREEN 4TH GENERATION: NONREACTIVE

## 2019-01-23 LAB — HEPATITIS C ANTIBODY: Hep C Virus Ab: <0.1 s/co ratio (ref 0.0–0.9)

## 2019-01-23 LAB — PSA: Prostate Specific Ag, Serum: 1.8 ng/mL (ref 0.0–4.0)

## 2019-01-25 ENCOUNTER — Telehealth: Payer: Self-pay | Admitting: Family Medicine

## 2019-01-25 NOTE — Telephone Encounter (Signed)
Done, letter was sent 2 days ago per patient request though  Copied from [place] [id number]. Topic: General - Inquiry >> Jan 25, 2019 10:57 AM Rutherford Nail, NT wrote: Reason for CRM: Patient calling and would like to know if his latest results from his physical on 01/22/2019 could be release to his MyChart?

## 2019-04-02 ENCOUNTER — Encounter: Payer: Self-pay | Admitting: Family Medicine

## 2019-09-15 ENCOUNTER — Other Ambulatory Visit: Payer: Self-pay | Admitting: Family Medicine

## 2019-09-16 NOTE — Telephone Encounter (Signed)
Requested medication (s) are due for refill today: yes  Requested medication (s) are on the active medication list: yes  Last refill:  04/23/2019  Future visit scheduled:yes  Notes to clinic: ordering provider and pcp are different    Requested Prescriptions  Pending Prescriptions Disp Refills   meloxicam (MOBIC) 15 MG tablet [Pharmacy Med Name: Meloxicam 15 MG Oral Tablet] 90 tablet 0    Sig: Take 1 tablet by mouth once daily     Analgesics:  COX2 Inhibitors Passed - 09/15/2019 10:54 AM      Passed - HGB in normal range and within 360 days    Hemoglobin  Date Value Ref Range Status  01/22/2019 16.8 13.0 - 17.7 g/dL Final         Passed - Cr in normal range and within 360 days    Creatinine, Ser  Date Value Ref Range Status  01/22/2019 1.14 0.76 - 1.27 mg/dL Final         Passed - Patient is not pregnant      Passed - Valid encounter within last 12 months    Recent Outpatient Visits          7 months ago Annual physical exam   [facility] [place], [place]   1 year ago Upper respiratory tract infection, unspecified type   [facility], Lilia Argue, [place]   1 year ago Hypogonadism in male   [place], Jeannette How, MD   2 years ago Annual physical exam   [facility], [place]   3 years ago Immunization due   [place], Jeannette How, MD      Future Appointments            In 4 months [place], Jeannette How, MD [facility], [facility]

## 2019-09-17 NOTE — Telephone Encounter (Signed)
Routing to provider  

## 2019-11-22 DIAGNOSIS — H43813 Vitreous degeneration, bilateral: Secondary | ICD-10-CM | POA: Diagnosis not present

## 2019-11-22 DIAGNOSIS — H524 Presbyopia: Secondary | ICD-10-CM | POA: Diagnosis not present

## 2019-12-03 DIAGNOSIS — H43812 Vitreous degeneration, left eye: Secondary | ICD-10-CM | POA: Diagnosis not present

## 2019-12-03 DIAGNOSIS — H35372 Puckering of macula, left eye: Secondary | ICD-10-CM | POA: Diagnosis not present

## 2019-12-03 DIAGNOSIS — H43392 Other vitreous opacities, left eye: Secondary | ICD-10-CM | POA: Diagnosis not present

## 2019-12-04 DIAGNOSIS — L570 Actinic keratosis: Secondary | ICD-10-CM | POA: Diagnosis not present

## 2019-12-04 DIAGNOSIS — D225 Melanocytic nevi of trunk: Secondary | ICD-10-CM | POA: Diagnosis not present

## 2019-12-04 DIAGNOSIS — X32XXXA Exposure to sunlight, initial encounter: Secondary | ICD-10-CM | POA: Diagnosis not present

## 2019-12-04 DIAGNOSIS — Z85828 Personal history of other malignant neoplasm of skin: Secondary | ICD-10-CM | POA: Diagnosis not present

## 2019-12-04 DIAGNOSIS — L821 Other seborrheic keratosis: Secondary | ICD-10-CM | POA: Diagnosis not present

## 2019-12-04 DIAGNOSIS — D2272 Melanocytic nevi of left lower limb, including hip: Secondary | ICD-10-CM | POA: Diagnosis not present

## 2019-12-04 DIAGNOSIS — D2261 Melanocytic nevi of right upper limb, including shoulder: Secondary | ICD-10-CM | POA: Diagnosis not present

## 2019-12-24 DIAGNOSIS — G4733 Obstructive sleep apnea (adult) (pediatric): Secondary | ICD-10-CM | POA: Diagnosis not present

## 2019-12-24 DIAGNOSIS — H35072 Retinal telangiectasis, left eye: Secondary | ICD-10-CM | POA: Diagnosis not present

## 2019-12-24 DIAGNOSIS — H353123 Nonexudative age-related macular degeneration, left eye, advanced atrophic without subfoveal involvement: Secondary | ICD-10-CM | POA: Diagnosis not present

## 2019-12-24 DIAGNOSIS — H35372 Puckering of macula, left eye: Secondary | ICD-10-CM | POA: Diagnosis not present

## 2019-12-25 ENCOUNTER — Telehealth: Payer: Self-pay

## 2019-12-25 NOTE — Telephone Encounter (Signed)
Appt scheduled

## 2019-12-25 NOTE — Telephone Encounter (Signed)
Copied from [place] [phone]. Topic: General - Call Back - No Documentation >> Dec 24, 2019  6:08 PM Erick Blinks wrote: Reason for CRM: Pt called requesting sleep apnea test, please advise  Best contact: [phone]

## 2020-01-01 ENCOUNTER — Encounter: Payer: Self-pay | Admitting: Family Medicine

## 2020-01-01 ENCOUNTER — Ambulatory Visit (INDEPENDENT_AMBULATORY_CARE_PROVIDER_SITE_OTHER): Payer: Medicare HMO | Admitting: Family Medicine

## 2020-01-01 ENCOUNTER — Other Ambulatory Visit: Payer: Self-pay

## 2020-01-01 VITALS — Ht 70.0 in | Wt 190.0 lb

## 2020-01-01 DIAGNOSIS — R400 Somnolence: Secondary | ICD-10-CM | POA: Diagnosis not present

## 2020-01-01 DIAGNOSIS — R0683 Snoring: Secondary | ICD-10-CM

## 2020-01-01 NOTE — Progress Notes (Signed)
Ht 5\' 10"  (1.778 m)   Wt 190 lb (86.2 kg)   BMI 27.26 kg/m    Subjective:    Patient ID: William Numbers., male    DOB: 1956/12/12, 64 y.o.   MRN: [id number]  HPI: William Richardson. is a 64 y.o. male  Chief Complaint  Patient presents with  . Sleeping Problem    pt requested a sleep apnea test    . This visit was completed via WebEx due to the restrictions of the COVID-19 pandemic. All issues as above were discussed and addressed. Physical exam was done as above through visual confirmation on WebEx. If it was felt that the patient should be evaluated in the office, they were directed there. The patient verbally consented to this visit. . Location of the patient: home . Location of the provider: home . Those involved with this call:  . Provider: Merrie Roof, PA-C . CMA: Lesle Chris, [place] . Front Desk/Registration: Jill Side  . Time spent on call: 15 minutes with patient face to face via video conference. More than 50% of this time was spent in counseling and coordination of care. 5 minutes total spent in review of patient's record and preparation of their chart. I verified patient identity using two factors (patient name and date of birth). Patient consents verbally to being seen via telemedicine visit today.   Patient presenting today with concerns about sleep. Waking numerous times per night, feeling fatigued during the days, wife states he snores. Wanting to have a sleep evaluation. Has never had one in the past. Denies known apneic episodes.   Relevant past medical, surgical, family and social history reviewed and updated as indicated. Interim medical history since our last visit reviewed. Allergies and medications reviewed and updated.  Review of Systems  Per HPI unless specifically indicated above     Objective:    Ht 5\' 10"  (1.778 m)   Wt 190 lb (86.2 kg)   BMI 27.26 kg/m   Wt Readings from Last 3 Encounters:  01/01/20 190 lb (86.2 kg)   01/22/19 181 lb 3 oz (82.2 kg)  04/07/18 189 lb 9.6 oz (86 kg)    Physical Exam Vitals and nursing note reviewed.  Constitutional:      General: He is not in acute distress.    Appearance: Normal appearance.  HENT:     Head: Atraumatic.     Right Ear: External ear normal.     Left Ear: External ear normal.     Nose: Nose normal. No congestion.     Mouth/Throat:     Mouth: Mucous membranes are moist.     Pharynx: Oropharynx is clear.  Eyes:     Extraocular Movements: Extraocular movements intact.     Conjunctiva/sclera: Conjunctivae normal.  Pulmonary:     Effort: Pulmonary effort is normal. No respiratory distress.  Musculoskeletal:        General: Normal range of motion.     Cervical back: Normal range of motion.  Skin:    General: Skin is dry.     Findings: No erythema or rash.  Neurological:     Mental Status: He is oriented to person, place, and time.  Psychiatric:        Mood and Affect: Mood normal.        Thought Content: Thought content normal.        Judgment: Judgment normal.     Results for orders placed or performed in visit on 01/22/19  CBC with Differential/Platelet  Result Value Ref Range   WBC 6.5 3.4 - 10.8 x10E3/uL   RBC 5.47 4.14 - 5.80 x10E6/uL   Hemoglobin 16.8 13.0 - 17.7 g/dL   Hematocrit 49.6 37.5 - 51.0 %   MCV 91 79 - 97 fL   MCH 30.7 26.6 - 33.0 pg   MCHC 33.9 31.5 - 35.7 g/dL   RDW 13.1 11.6 - 15.4 %   Platelets 235 150 - 450 x10E3/uL   Neutrophils 71 Not Estab. %   Lymphs 18 Not Estab. %   Monocytes 9 Not Estab. %   Eos 1 Not Estab. %   Basos 1 Not Estab. %   Neutrophils Absolute 4.6 1.4 - 7.0 x10E3/uL   Lymphocytes Absolute 1.2 0.7 - 3.1 x10E3/uL   Monocytes Absolute 0.6 0.1 - 0.9 x10E3/uL   EOS (ABSOLUTE) 0.1 0.0 - 0.4 x10E3/uL   Basophils Absolute 0.1 0.0 - 0.2 x10E3/uL   Immature Granulocytes 0 Not Estab. %   Immature Grans (Abs) 0.0 0.0 - 0.1 x10E3/uL  Comprehensive metabolic panel  Result Value Ref Range   Glucose 104  (H) 65 - 99 mg/dL   BUN 11 8 - 27 mg/dL   Creatinine, Ser 1.14 0.76 - 1.27 mg/dL   GFR calc non Af Amer 68 >59 mL/min/1.73   GFR calc Af Amer 78 >59 mL/min/1.73   BUN/Creatinine Ratio 10 10 - 24   Sodium 144 134 - 144 mmol/L   Potassium 4.6 3.5 - 5.2 mmol/L   Chloride 101 96 - 106 mmol/L   CO2 25 20 - 29 mmol/L   Calcium 10.1 8.6 - 10.2 mg/dL   Total Protein 7.0 6.0 - 8.5 g/dL   Albumin 5.0 (H) 3.8 - 4.8 g/dL   Globulin, Total 2.0 1.5 - 4.5 g/dL   Albumin/Globulin Ratio 2.5 (H) 1.2 - 2.2   Bilirubin Total 0.7 0.0 - 1.2 mg/dL   Alkaline Phosphatase 87 39 - 117 IU/L   AST 16 0 - 40 IU/L   ALT 17 0 - 44 IU/L  Lipid Panel w/o Chol/HDL Ratio  Result Value Ref Range   Cholesterol, Total 194 100 - 199 mg/dL   Triglycerides 84 0 - 149 mg/dL   HDL 60 >39 mg/dL   VLDL Cholesterol Cal 17 5 - 40 mg/dL   LDL Calculated 117 (H) 0 - 99 mg/dL  TSH  Result Value Ref Range   TSH 1.830 0.450 - 4.500 uIU/mL  UA/M w/rflx Culture, Routine   Specimen: Urine   URINE  Result Value Ref Range   Specific Gravity, UA 1.025 1.005 - 1.030   pH, UA 5.5 5.0 - 7.5   Color, UA Yellow Yellow   Appearance Ur Clear Clear   Leukocytes, UA Negative Negative   Protein, UA Negative Negative/Trace   Glucose, UA Negative Negative   Ketones, UA Negative Negative   RBC, UA Negative Negative   Bilirubin, UA Negative Negative   Urobilinogen, Ur 0.2 0.2 - 1.0 mg/dL   Nitrite, UA Negative Negative  HIV Antibody (routine testing w rflx)  Result Value Ref Range   HIV Screen 4th Generation wRfx Non Reactive Non Reactive  Hepatitis C antibody  Result Value Ref Range   Hep C Virus Ab <0.1 0.0 - 0.9 s/co ratio  PSA  Result Value Ref Range   Prostate Specific Ag, Serum 1.8 0.0 - 4.0 ng/mL      Assessment & Plan:   Problem List Items Addressed This Visit    None  Visit Diagnoses    Snoring    -  Primary   Daytime somnolence         Will refer to sleep specialist for sleep study to evaluate for sleep apnea.  Discussed sleep hygiene   Follow up plan: Return if symptoms worsen or fail to improve.

## 2020-01-08 ENCOUNTER — Encounter: Payer: Self-pay | Admitting: Family Medicine

## 2020-01-09 ENCOUNTER — Ambulatory Visit: Payer: Medicare HMO | Attending: Internal Medicine

## 2020-01-09 DIAGNOSIS — Z20822 Contact with and (suspected) exposure to covid-19: Secondary | ICD-10-CM

## 2020-01-10 LAB — NOVEL CORONAVIRUS, NAA: SARS-CoV-2, NAA: NOT DETECTED

## 2020-01-15 DIAGNOSIS — G4733 Obstructive sleep apnea (adult) (pediatric): Secondary | ICD-10-CM | POA: Diagnosis not present

## 2020-01-18 ENCOUNTER — Telehealth: Payer: Self-pay

## 2020-01-18 NOTE — Telephone Encounter (Signed)
Reached out to referrals team and they were going to check in with patient to discuss sleep study.  Copied from [place] [phone]. Topic: General - Other >> Jan 18, 2020  9:55 AM Rainey Pines A wrote: Prior authorization was denied by insurance for sleep study from [facility]. They now need a copy of survey that was completed and to appeal it. Member # [id number] Reference # [id number] Patient would like a callback from nurse once this is complete.

## 2020-01-23 ENCOUNTER — Encounter: Payer: Self-pay | Admitting: Family Medicine

## 2020-01-28 ENCOUNTER — Encounter: Payer: BLUE CROSS/BLUE SHIELD | Admitting: Family Medicine

## 2020-01-28 DIAGNOSIS — R69 Illness, unspecified: Secondary | ICD-10-CM | POA: Diagnosis not present

## 2020-03-10 ENCOUNTER — Encounter: Payer: Self-pay | Admitting: Family Medicine

## 2020-03-10 ENCOUNTER — Other Ambulatory Visit: Payer: Self-pay

## 2020-03-10 ENCOUNTER — Telehealth: Payer: Self-pay

## 2020-03-10 ENCOUNTER — Ambulatory Visit (INDEPENDENT_AMBULATORY_CARE_PROVIDER_SITE_OTHER): Payer: Medicare HMO | Admitting: Family Medicine

## 2020-03-10 VITALS — BP 108/72 | HR 86 | Temp 97.9°F | Resp 18 | Ht 70.0 in | Wt 196.2 lb

## 2020-03-10 DIAGNOSIS — G4733 Obstructive sleep apnea (adult) (pediatric): Secondary | ICD-10-CM

## 2020-03-10 DIAGNOSIS — Z23 Encounter for immunization: Secondary | ICD-10-CM | POA: Diagnosis not present

## 2020-03-10 DIAGNOSIS — B002 Herpesviral gingivostomatitis and pharyngotonsillitis: Secondary | ICD-10-CM

## 2020-03-10 DIAGNOSIS — B001 Herpesviral vesicular dermatitis: Secondary | ICD-10-CM | POA: Insufficient documentation

## 2020-03-10 DIAGNOSIS — E785 Hyperlipidemia, unspecified: Secondary | ICD-10-CM | POA: Insufficient documentation

## 2020-03-10 DIAGNOSIS — G8929 Other chronic pain: Secondary | ICD-10-CM | POA: Diagnosis not present

## 2020-03-10 DIAGNOSIS — M25512 Pain in left shoulder: Secondary | ICD-10-CM | POA: Diagnosis not present

## 2020-03-10 DIAGNOSIS — E7800 Pure hypercholesterolemia, unspecified: Secondary | ICD-10-CM | POA: Diagnosis not present

## 2020-03-10 DIAGNOSIS — G4730 Sleep apnea, unspecified: Secondary | ICD-10-CM | POA: Insufficient documentation

## 2020-03-10 MED ORDER — SHINGRIX 50 MCG/0.5ML IM SUSR: 0.5000 mL | Freq: Once | INTRAMUSCULAR | 1 refills | Status: AC

## 2020-03-10 NOTE — Assessment & Plan Note (Addendum)
Discussed ASCVD risk is 8.5% based on last years labs and may benefit from cholesterol medication. Repeat lipids and reassess risk this year. If worse will highly recommend statin therapy.

## 2020-03-10 NOTE — Patient Instructions (Addendum)
#   High Cholesterol - Get regular exercise - Return for cholesterol blood work - Make healthy diet choices

## 2020-03-10 NOTE — Assessment & Plan Note (Signed)
Evaluated and diagnosed at [facility]. Will get records. Prescription for auto CPAP ordered today

## 2020-03-10 NOTE — Telephone Encounter (Signed)
Prescription done and note completed

## 2020-03-10 NOTE — Assessment & Plan Note (Addendum)
Hx of rotator cuff surgery. Takes meloxicam about 1 time per week. Discussed to avoid increasing use of the this medication

## 2020-03-10 NOTE — Assessment & Plan Note (Signed)
Takes period Valacyclovir for outbreaks. Only 2-3 times per year.

## 2020-03-10 NOTE — Progress Notes (Signed)
Subjective:     William Richardson. is a 64 y.o. male presenting for Establish Care (previous PCP Dr Luvenia Heller) and Discuss CPAP (has been trying to get RX for CPAP and get started on this)     HPI   #Sleep apnea - got a sleep study - trying to get the CPAP prescription but having issues getting it filled - was having snoring and daytime sleepiness - Went to [facility] and was diagnosed with sleep apnea.    Review of Systems   Social History   Tobacco Use  Smoking Status Never Smoker  Smokeless Tobacco Never Used        Objective:    BP Readings from Last 3 Encounters:  03/10/20 108/72  01/22/19 127/84  04/07/18 130/84   Wt Readings from Last 3 Encounters:  03/10/20 196 lb 4 oz (89 kg)  01/01/20 190 lb (86.2 kg)  01/22/19 181 lb 3 oz (82.2 kg)    BP 108/72   Pulse 86   Temp 97.9 F (36.6 C)   Resp 18   Ht 5\' 10"  (1.778 m)   Wt 196 lb 4 oz (89 kg)   SpO2 97%   BMI 28.16 kg/m    Physical Exam Constitutional:      Appearance: Normal appearance. He is not ill-appearing or diaphoretic.  HENT:     Right Ear: External ear normal.     Left Ear: External ear normal.     Nose: Nose normal.  Eyes:     General: No scleral icterus.    Extraocular Movements: Extraocular movements intact.     Conjunctiva/sclera: Conjunctivae normal.  Cardiovascular:     Rate and Rhythm: Normal rate and regular rhythm.  Pulmonary:     Effort: Pulmonary effort is normal. No respiratory distress.     Breath sounds: No wheezing.  Musculoskeletal:     Cervical back: Neck supple.  Skin:    General: Skin is warm and dry.  Neurological:     Mental Status: He is alert. Mental status is at baseline.  Psychiatric:        Mood and Affect: Mood normal.      The 10-year ASCVD risk score Mikey Bussing DC Jr., et al., 2013) is: 8.5%   Values used to calculate the score:     Age: 80 years     Sex: Male     Is Non-Hispanic African American: No     Diabetic: No     Tobacco smoker: No     Systolic Blood Pressure: 123XX123 mmHg     Is BP treated: No     HDL Cholesterol: 60 mg/dL     Total Cholesterol: 194 mg/dL      Assessment & Plan:   Problem List Items Addressed This Visit      Respiratory   Sleep apnea    Evaluated and diagnosed at [facility]. Will get records. Prescription for auto CPAP ordered today        Digestive   Recurrent oral herpes simplex    Takes period Valacyclovir for outbreaks. Only 2-3 times per year.         Other   Chronic shoulder pain    Hx of rotator cuff surgery. Takes meloxicam about 1 time per week. Discussed to avoid increasing use of the this medication      Hyperlipidemia    Discussed ASCVD risk is 8.5% based on last years labs and may benefit from cholesterol medication.  Repeat lipids and reassess risk this year. If worse will highly recommend statin therapy.       Relevant Orders   Lipid panel       Return in about 1 year (around 03/10/2021).  Lesleigh Noe, MD

## 2020-03-10 NOTE — Addendum Note (Signed)
Addended by: Kris Mouton on: 03/10/2020 04:57 PM   Modules accepted: Orders

## 2020-03-10 NOTE — Telephone Encounter (Signed)
Patient needs RX for APAP-measurements to say 4-20, faxed to [facility] in [place] ([address] [place]) [phone] (fax for [place] office and they will re direct to the [place] location. Need to send sleep study, face to face notes also, need to mention CPAP/sleep apnea in the note from today's visit.

## 2020-03-11 NOTE — Telephone Encounter (Signed)
Information faxed over as requested by the patient

## 2020-03-12 ENCOUNTER — Other Ambulatory Visit (INDEPENDENT_AMBULATORY_CARE_PROVIDER_SITE_OTHER): Payer: Medicare HMO

## 2020-03-12 ENCOUNTER — Other Ambulatory Visit: Payer: Self-pay

## 2020-03-12 ENCOUNTER — Telehealth: Payer: Self-pay

## 2020-03-12 DIAGNOSIS — E7800 Pure hypercholesterolemia, unspecified: Secondary | ICD-10-CM

## 2020-03-12 LAB — LIPID PANEL
Cholesterol: 162 mg/dL (ref 0–200)
HDL: 43.2 mg/dL (ref >39.00)
LDL Cholesterol: 94 mg/dL (ref 0–99)
NonHDL: 118.9
Total CHOL/HDL Ratio: 4
Triglycerides: 123 mg/dL (ref 0.0–149.0)
VLDL: 24.6 mg/dL (ref 0.0–40.0)

## 2020-03-12 NOTE — Telephone Encounter (Addendum)
Spoke with patient and advised of below. Patient said ok and will let us know.  From: Lesleigh Noe, MD                   He didn't mention this. I would want to discuss his symptoms prior to ordering labs.   Damarys Speir,   Can you call him to let him know he did not mention the fatigue so prior to getting blood work I would want to discuss it in more detail. Also mention, that Sleep apnea is a very common cause of fatigue and he could plan to follow-up about 1 month after he starts using his CPAP to see if that improves the symptoms prior to getting any additional blood work.   Lesleigh Noe ----- Message ----- From: Ellamae Sia Sent: 03/12/2020  10:32 AM EDT To: Lesleigh Noe, MD Subject: add a test??                                   Patient wanted tests for fatigue, he wasn't sure if he mentioned it to you at his appt. I drew extra blood if you want to add anything

## 2020-03-17 DIAGNOSIS — G4730 Sleep apnea, unspecified: Secondary | ICD-10-CM | POA: Diagnosis not present

## 2020-03-17 DIAGNOSIS — G4733 Obstructive sleep apnea (adult) (pediatric): Secondary | ICD-10-CM | POA: Diagnosis not present

## 2020-03-17 NOTE — Telephone Encounter (Signed)
Error. Disregard

## 2020-03-18 ENCOUNTER — Encounter: Payer: Self-pay | Admitting: Family Medicine

## 2020-03-18 DIAGNOSIS — G4733 Obstructive sleep apnea (adult) (pediatric): Secondary | ICD-10-CM | POA: Diagnosis not present

## 2020-03-18 DIAGNOSIS — G4730 Sleep apnea, unspecified: Secondary | ICD-10-CM | POA: Diagnosis not present

## 2020-03-18 MED ORDER — MELOXICAM 15 MG PO TABS: 15.0000 mg | ORAL_TABLET | Freq: Every day | ORAL | 0 refills | Status: DC

## 2020-03-20 DIAGNOSIS — G4730 Sleep apnea, unspecified: Secondary | ICD-10-CM | POA: Diagnosis not present

## 2020-03-20 DIAGNOSIS — G4733 Obstructive sleep apnea (adult) (pediatric): Secondary | ICD-10-CM | POA: Diagnosis not present

## 2020-03-24 ENCOUNTER — Encounter (INDEPENDENT_AMBULATORY_CARE_PROVIDER_SITE_OTHER): Payer: Medicare HMO | Admitting: Ophthalmology

## 2020-03-26 ENCOUNTER — Encounter: Payer: Self-pay | Admitting: Family Medicine

## 2020-04-17 DIAGNOSIS — G4730 Sleep apnea, unspecified: Secondary | ICD-10-CM | POA: Diagnosis not present

## 2020-04-17 DIAGNOSIS — G4733 Obstructive sleep apnea (adult) (pediatric): Secondary | ICD-10-CM | POA: Diagnosis not present

## 2020-04-18 DIAGNOSIS — G4733 Obstructive sleep apnea (adult) (pediatric): Secondary | ICD-10-CM | POA: Diagnosis not present

## 2020-04-18 DIAGNOSIS — G4730 Sleep apnea, unspecified: Secondary | ICD-10-CM | POA: Diagnosis not present

## 2020-05-05 DIAGNOSIS — Z833 Family history of diabetes mellitus: Secondary | ICD-10-CM | POA: Diagnosis not present

## 2020-05-05 DIAGNOSIS — K219 Gastro-esophageal reflux disease without esophagitis: Secondary | ICD-10-CM | POA: Diagnosis not present

## 2020-05-05 DIAGNOSIS — G4730 Sleep apnea, unspecified: Secondary | ICD-10-CM | POA: Diagnosis not present

## 2020-05-05 DIAGNOSIS — G8929 Other chronic pain: Secondary | ICD-10-CM | POA: Diagnosis not present

## 2020-05-05 DIAGNOSIS — Z791 Long term (current) use of non-steroidal anti-inflammatories (NSAID): Secondary | ICD-10-CM | POA: Diagnosis not present

## 2020-05-05 DIAGNOSIS — R030 Elevated blood-pressure reading, without diagnosis of hypertension: Secondary | ICD-10-CM | POA: Diagnosis not present

## 2020-05-17 DIAGNOSIS — G4730 Sleep apnea, unspecified: Secondary | ICD-10-CM | POA: Diagnosis not present

## 2020-05-17 DIAGNOSIS — G4733 Obstructive sleep apnea (adult) (pediatric): Secondary | ICD-10-CM | POA: Diagnosis not present

## 2020-05-18 DIAGNOSIS — G4733 Obstructive sleep apnea (adult) (pediatric): Secondary | ICD-10-CM | POA: Diagnosis not present

## 2020-05-18 DIAGNOSIS — G4730 Sleep apnea, unspecified: Secondary | ICD-10-CM | POA: Diagnosis not present

## 2020-06-17 DIAGNOSIS — G4730 Sleep apnea, unspecified: Secondary | ICD-10-CM | POA: Diagnosis not present

## 2020-06-17 DIAGNOSIS — G4733 Obstructive sleep apnea (adult) (pediatric): Secondary | ICD-10-CM | POA: Diagnosis not present

## 2020-06-27 DIAGNOSIS — G4733 Obstructive sleep apnea (adult) (pediatric): Secondary | ICD-10-CM | POA: Diagnosis not present

## 2020-06-27 DIAGNOSIS — G4730 Sleep apnea, unspecified: Secondary | ICD-10-CM | POA: Diagnosis not present

## 2020-07-17 DIAGNOSIS — G4733 Obstructive sleep apnea (adult) (pediatric): Secondary | ICD-10-CM | POA: Diagnosis not present

## 2020-07-17 DIAGNOSIS — G4730 Sleep apnea, unspecified: Secondary | ICD-10-CM | POA: Diagnosis not present

## 2020-07-28 DIAGNOSIS — G4733 Obstructive sleep apnea (adult) (pediatric): Secondary | ICD-10-CM | POA: Diagnosis not present

## 2020-07-28 DIAGNOSIS — G4730 Sleep apnea, unspecified: Secondary | ICD-10-CM | POA: Diagnosis not present

## 2020-08-17 DIAGNOSIS — G4733 Obstructive sleep apnea (adult) (pediatric): Secondary | ICD-10-CM | POA: Diagnosis not present

## 2020-08-17 DIAGNOSIS — G4730 Sleep apnea, unspecified: Secondary | ICD-10-CM | POA: Diagnosis not present

## 2020-08-26 DIAGNOSIS — D2262 Melanocytic nevi of left upper limb, including shoulder: Secondary | ICD-10-CM | POA: Diagnosis not present

## 2020-08-26 DIAGNOSIS — D225 Melanocytic nevi of trunk: Secondary | ICD-10-CM | POA: Diagnosis not present

## 2020-08-26 DIAGNOSIS — L570 Actinic keratosis: Secondary | ICD-10-CM | POA: Diagnosis not present

## 2020-08-26 DIAGNOSIS — D2272 Melanocytic nevi of left lower limb, including hip: Secondary | ICD-10-CM | POA: Diagnosis not present

## 2020-08-26 DIAGNOSIS — D2261 Melanocytic nevi of right upper limb, including shoulder: Secondary | ICD-10-CM | POA: Diagnosis not present

## 2020-08-26 DIAGNOSIS — Z85828 Personal history of other malignant neoplasm of skin: Secondary | ICD-10-CM | POA: Diagnosis not present

## 2020-08-28 DIAGNOSIS — G4733 Obstructive sleep apnea (adult) (pediatric): Secondary | ICD-10-CM | POA: Diagnosis not present

## 2020-08-28 DIAGNOSIS — G4730 Sleep apnea, unspecified: Secondary | ICD-10-CM | POA: Diagnosis not present

## 2020-08-29 DIAGNOSIS — Z20822 Contact with and (suspected) exposure to covid-19: Secondary | ICD-10-CM | POA: Diagnosis not present

## 2020-08-30 ENCOUNTER — Encounter: Payer: Self-pay | Admitting: Family Medicine

## 2020-09-04 ENCOUNTER — Encounter: Payer: Self-pay | Admitting: Family Medicine

## 2020-09-04 ENCOUNTER — Other Ambulatory Visit: Payer: Self-pay

## 2020-09-04 ENCOUNTER — Telehealth (INDEPENDENT_AMBULATORY_CARE_PROVIDER_SITE_OTHER): Payer: Medicare HMO | Admitting: Family Medicine

## 2020-09-04 VITALS — Ht 70.0 in

## 2020-09-04 DIAGNOSIS — R059 Cough, unspecified: Secondary | ICD-10-CM | POA: Insufficient documentation

## 2020-09-04 DIAGNOSIS — R05 Cough: Secondary | ICD-10-CM | POA: Diagnosis not present

## 2020-09-04 MED ORDER — GUAIFENESIN-CODEINE 100-10 MG/5ML PO SYRP: 5.0000 mL | ORAL_SOLUTION | Freq: Every evening | ORAL | 0 refills | Status: DC | PRN

## 2020-09-04 MED ORDER — AZITHROMYCIN 250 MG PO TABS: ORAL_TABLET | ORAL | 0 refills | Status: DC

## 2020-09-04 NOTE — Assessment & Plan Note (Signed)
Neg COVID test.  Start antibiotics as > 9 days from onset and worsening symtpoms.. concern for bacterial superinfeciton.  Rest, fluids, cough suppressant . If not improving in 48 hours.. need in person exam and possible COVID retesting, possible CXR.

## 2020-09-04 NOTE — Progress Notes (Signed)
VIRTUAL VISIT Due to national recommendations of social distancing due to [place] 19, a virtual visit is felt to be most appropriate for this patient at this time.   I connected with the patient on 09/04/20 at  3:20 PM EDT by virtual telehealth platform and verified that I am speaking with the correct person using two identifiers.   I discussed the limitations, risks, security and privacy concerns of performing an evaluation and management service by  virtual telehealth platform and the availability of in person appointments. I also discussed with the patient that there may be a patient responsible charge related to this service. The patient expressed understanding and agreed to proceed.  Patient location: Home Provider Location: [facility] Participants: Iisha Soyars Diona Browner and Eskenazi Daniels Izell Nittany.   Chief Complaint  Patient presents with  . Cough  . Nasal Congestion  . Headache    History of Present Illness:  64 year old male presents with new onset  cough and congestion   X 9-10  productive cough, now has moved to chest. No face pain, head pressure, face pressure.  feels gradually worsening.   Mild ST, no fever, no SOB, no wheeze.   Started after was at [employer].   Taking halls, mucinex, delsym   Hx of allergies, no chronic lung disease.  nonsmoker.   NO COVID VACCINE.   Negative COVID Home test 9/10 and [facility] 9/12  COVID 19 screen No recent travel or known exposure to [place]  The importance of social distancing was discussed today.   Review of Systems  Constitutional: Negative for chills and fever.  HENT: Positive for congestion. Negative for ear pain.   Eyes: Negative for pain and redness.  Respiratory: Positive for cough. Negative for shortness of breath.   Cardiovascular: Negative for chest pain, palpitations and leg swelling.  Gastrointestinal: Negative for abdominal pain, blood in stool, constipation, diarrhea, nausea and vomiting.  Genitourinary: Negative  for dysuria.  Musculoskeletal: Negative for falls and myalgias.  Skin: Negative for rash.  Neurological: Negative for dizziness.  Psychiatric/Behavioral: Negative for depression. The patient is not nervous/anxious.       Past Medical History:  Diagnosis Date  . Hypogonadism in male   . Seasonal allergies   . Sleep apnea     reports that he has never smoked. He has never used smokeless tobacco. He reports that he does not drink alcohol and does not use drugs.   Current Outpatient Medications:  .  diphenhydrAMINE-APAP, sleep, (TYLENOL PM EXTRA STRENGTH PO), Take by mouth as needed., Disp: , Rfl:  .  fluticasone (FLONASE) 50 MCG/ACT nasal spray, Place 2 sprays into both nostrils daily., Disp: 3 g, Rfl: 12 .  meloxicam (MOBIC) 15 MG tablet, Take 1 tablet (15 mg total) by mouth daily., Disp: 90 tablet, Rfl: 0 .  valACYclovir (VALTREX) 500 MG tablet, Take by mouth as needed. , Disp: , Rfl:    Observations/Objective: Height 5\' 10"  (1.778 m).  Physical Exam  Physical Exam Constitutional:      General: The patient is not in acute distress. Pulmonary:     Effort: Pulmonary effort is normal. No respiratory distress.  Neurological:     Mental Status: The patient is alert and oriented to person, place, and time.  Psychiatric:        Mood and Affect: Mood normal.        Behavior: Behavior normal.   Assessment and Plan Cough Neg COVID test.  Start antibiotics as > 9 days from onset  and worsening symtpoms.. concern for bacterial superinfeciton.  Rest, fluids, cough suppressant . If not improving in 48 hours.. need in person exam and possible COVID retesting, possible CXR.     I discussed the assessment and treatment plan with the patient. The patient was provided an opportunity to ask questions and all were answered. The patient agreed with the plan and demonstrated an understanding of the instructions.   The patient was advised to call back or seek an in-person evaluation if the  symptoms worsen or if the condition fails to improve as anticipated.     Eliezer Lofts, MD

## 2020-09-04 NOTE — Patient Instructions (Signed)
Neg COVID test.  Start antibiotics as > 9 days from onset and worsening symtpoms.. concern for bacterial superinfeciton.  Rest, fluids, cough suppressant . If not improving in 48 hours.. need in person exam and possible COVID retesting, possible CXR.

## 2020-09-07 ENCOUNTER — Encounter: Payer: Self-pay | Admitting: Family Medicine

## 2020-09-17 DIAGNOSIS — G4730 Sleep apnea, unspecified: Secondary | ICD-10-CM | POA: Diagnosis not present

## 2020-09-17 DIAGNOSIS — G4733 Obstructive sleep apnea (adult) (pediatric): Secondary | ICD-10-CM | POA: Diagnosis not present

## 2020-09-24 DIAGNOSIS — G4733 Obstructive sleep apnea (adult) (pediatric): Secondary | ICD-10-CM | POA: Diagnosis not present

## 2020-09-24 DIAGNOSIS — G4730 Sleep apnea, unspecified: Secondary | ICD-10-CM | POA: Diagnosis not present

## 2020-10-17 DIAGNOSIS — G4733 Obstructive sleep apnea (adult) (pediatric): Secondary | ICD-10-CM | POA: Diagnosis not present

## 2020-10-17 DIAGNOSIS — G4730 Sleep apnea, unspecified: Secondary | ICD-10-CM | POA: Diagnosis not present

## 2020-10-25 DIAGNOSIS — G4733 Obstructive sleep apnea (adult) (pediatric): Secondary | ICD-10-CM | POA: Diagnosis not present

## 2020-10-25 DIAGNOSIS — G4730 Sleep apnea, unspecified: Secondary | ICD-10-CM | POA: Diagnosis not present

## 2020-11-17 ENCOUNTER — Other Ambulatory Visit: Payer: Self-pay | Admitting: Family Medicine

## 2020-11-17 DIAGNOSIS — G4730 Sleep apnea, unspecified: Secondary | ICD-10-CM | POA: Diagnosis not present

## 2020-11-17 DIAGNOSIS — G4733 Obstructive sleep apnea (adult) (pediatric): Secondary | ICD-10-CM | POA: Diagnosis not present

## 2020-11-21 ENCOUNTER — Encounter: Payer: Self-pay | Admitting: Family Medicine

## 2020-11-24 DIAGNOSIS — G4730 Sleep apnea, unspecified: Secondary | ICD-10-CM | POA: Diagnosis not present

## 2020-11-24 DIAGNOSIS — G4733 Obstructive sleep apnea (adult) (pediatric): Secondary | ICD-10-CM | POA: Diagnosis not present

## 2020-12-17 DIAGNOSIS — G4733 Obstructive sleep apnea (adult) (pediatric): Secondary | ICD-10-CM | POA: Diagnosis not present

## 2020-12-17 DIAGNOSIS — G4730 Sleep apnea, unspecified: Secondary | ICD-10-CM | POA: Diagnosis not present

## 2021-01-08 DIAGNOSIS — G4733 Obstructive sleep apnea (adult) (pediatric): Secondary | ICD-10-CM | POA: Diagnosis not present

## 2021-01-08 DIAGNOSIS — G4730 Sleep apnea, unspecified: Secondary | ICD-10-CM | POA: Diagnosis not present

## 2021-01-14 ENCOUNTER — Telehealth: Payer: Self-pay

## 2021-01-14 ENCOUNTER — Ambulatory Visit (INDEPENDENT_AMBULATORY_CARE_PROVIDER_SITE_OTHER): Payer: Medicare HMO

## 2021-01-14 DIAGNOSIS — Z0000 Encounter for general adult medical examination without abnormal findings: Secondary | ICD-10-CM

## 2021-01-14 NOTE — Telephone Encounter (Signed)
Patient requested an appointment to have his lab work completed before his physical next week. Please advise.

## 2021-01-14 NOTE — Patient Instructions (Signed)
Mr. William Richardson , Thank you for taking time to come for your Medicare Wellness Visit. I appreciate your ongoing commitment to your health goals. Please review the following plan we discussed and let me know if I can assist you in the future.   Screening recommendations/referrals: Colonoscopy: Up to date, completed 11/18/2017, due 10/2027 Recommended yearly ophthalmology/optometry visit for glaucoma screening and checkup Recommended yearly dental visit for hygiene and checkup  Vaccinations: Influenza vaccine: declined Pneumococcal vaccine: Up to date, completed 03/10/2020, due 02/2021 Tdap vaccine: Up to date, completed 10/01/2015, due 09/2025 Shingles vaccine: due, check with your insurance regarding coverage if interested    Covid-19: declined  Advanced directives: Please bring a copy of your POA (Power of [place]) and/or Living Will to your next appointment.   Conditions/risks identified: hyperlipidemia  Next appointment: Follow up in one year for your annual wellness visit.   Preventive Care 89 Years and Older, Male Preventive care refers to lifestyle choices and visits with your health care provider that can promote health and wellness. What does preventive care include?  A yearly physical exam. This is also called an annual well check.  Dental exams once or twice a year.  Routine eye exams. Ask your health care provider how often you should have your eyes checked.  Personal lifestyle choices, including:  Daily care of your teeth and gums.  Regular physical activity.  Eating a healthy diet.  Avoiding tobacco and drug use.  Limiting alcohol use.  Practicing safe sex.  Taking low doses of aspirin every day.  Taking vitamin and mineral supplements as recommended by your health care provider. What happens during an annual well check? The services and screenings done by your health care provider during your annual well check will depend on your age, overall health, lifestyle  risk factors, and family history of disease. Counseling  Your health care provider may ask you questions about your:  Alcohol use.  Tobacco use.  Drug use.  Emotional well-being.  Home and relationship well-being.  Sexual activity.  Eating habits.  History of falls.  Memory and ability to understand (cognition).  Work and work Statistician. Screening  You may have the following tests or measurements:  Height, weight, and BMI.  Blood pressure.  Lipid and cholesterol levels. These may be checked every 5 years, or more frequently if you are over 32 years old.  Skin check.  Lung cancer screening. You may have this screening every year starting at age 43 if you have a 30-pack-year history of smoking and currently smoke or have quit within the past 15 years.  Fecal occult blood test (FOBT) of the stool. You may have this test every year starting at age 35.  Flexible sigmoidoscopy or colonoscopy. You may have a sigmoidoscopy every 5 years or a colonoscopy every 10 years starting at age 68.  Prostate cancer screening. Recommendations will vary depending on your family history and other risks.  Hepatitis C blood test.  Hepatitis B blood test.  Sexually transmitted disease (STD) testing.  Diabetes screening. This is done by checking your blood sugar (glucose) after you have not eaten for a while (fasting). You may have this done every 1-3 years.  Abdominal aortic aneurysm (AAA) screening. You may need this if you are a current or former smoker.  Osteoporosis. You may be screened starting at age 8 if you are at high risk. Talk with your health care provider about your test results, treatment options, and if necessary, the need for more tests.  Vaccines  Your health care provider may recommend certain vaccines, such as:  Influenza vaccine. This is recommended every year.  Tetanus, diphtheria, and acellular pertussis (Tdap, Td) vaccine. You may need a Td booster every 10  years.  Zoster vaccine. You may need this after age 11.  Pneumococcal 13-valent conjugate (PCV13) vaccine. One dose is recommended after age 54.  Pneumococcal polysaccharide (PPSV23) vaccine. One dose is recommended after age 33. Talk to your health care provider about which screenings and vaccines you need and how often you need them. This information is not intended to replace advice given to you by your health care provider. Make sure you discuss any questions you have with your health care provider. Document Released: 01/02/2016 Document Revised: 08/25/2016 Document Reviewed: 10/07/2015 Elsevier Interactive Patient Education  2017 [employer] Prevention in the Home Falls can cause injuries. They can happen to people of all ages. There are many things you can do to make your home safe and to help prevent falls. What can I do on the outside of my home?  Regularly fix the edges of walkways and driveways and fix any cracks.  Remove anything that might make you trip as you walk through a door, such as a raised step or threshold.  Trim any bushes or trees on the path to your home.  Use bright outdoor lighting.  Clear any walking paths of anything that might make someone trip, such as rocks or tools.  Regularly check to see if handrails are loose or broken. Make sure that both sides of any steps have handrails.  Any raised decks and porches should have guardrails on the edges.  Have any leaves, snow, or ice cleared regularly.  Use sand or salt on walking paths during winter.  Clean up any spills in your garage right away. This includes oil or grease spills. What can I do in the bathroom?  Use night lights.  Install grab bars by the toilet and in the tub and shower. Do not use towel bars as grab bars.  Use non-skid mats or decals in the tub or shower.  If you need to sit down in the shower, use a plastic, non-slip stool.  Keep the floor dry. Clean up any water that  spills on the floor as soon as it happens.  Remove soap buildup in the tub or shower regularly.  Attach bath mats securely with double-sided non-slip rug tape.  Do not have throw rugs and other things on the floor that can make you trip. What can I do in the bedroom?  Use night lights.  Make sure that you have a light by your bed that is easy to reach.  Do not use any sheets or blankets that are too big for your bed. They should not hang down onto the floor.  Have a firm chair that has side arms. You can use this for support while you get dressed.  Do not have throw rugs and other things on the floor that can make you trip. What can I do in the kitchen?  Clean up any spills right away.  Avoid walking on wet floors.  Keep items that you use a lot in easy-to-reach places.  If you need to reach something above you, use a strong step stool that has a grab bar.  Keep electrical cords out of the way.  Do not use floor polish or wax that makes floors slippery. If you must use wax, use non-skid floor wax.  Do not have throw rugs and other things on the floor that can make you trip. What can I do with my stairs?  Do not leave any items on the stairs.  Make sure that there are handrails on both sides of the stairs and use them. Fix handrails that are broken or loose. Make sure that handrails are as long as the stairways.  Check any carpeting to make sure that it is firmly attached to the stairs. Fix any carpet that is loose or worn.  Avoid having throw rugs at the top or bottom of the stairs. If you do have throw rugs, attach them to the floor with carpet tape.  Make sure that you have a light switch at the top of the stairs and the bottom of the stairs. If you do not have them, ask someone to add them for you. What else can I do to help prevent falls?  Wear shoes that:  Do not have high heels.  Have rubber bottoms.  Are comfortable and fit you well.  Are closed at the  toe. Do not wear sandals.  If you use a stepladder:  Make sure that it is fully opened. Do not climb a closed stepladder.  Make sure that both sides of the stepladder are locked into place.  Ask someone to hold it for you, if possible.  Clearly mark and make sure that you can see:  Any grab bars or handrails.  First and last steps.  Where the edge of each step is.  Use tools that help you move around (mobility aids) if they are needed. These include:  Canes.  Walkers.  Scooters.  Crutches.  Turn on the lights when you go into a dark area. Replace any light bulbs as soon as they burn out.  Set up your furniture so you have a clear path. Avoid moving your furniture around.  If any of your floors are uneven, fix them.  If there are any pets around you, be aware of where they are.  Review your medicines with your doctor. Some medicines can make you feel dizzy. This can increase your chance of falling. Ask your doctor what other things that you can do to help prevent falls. This information is not intended to replace advice given to you by your health care provider. Make sure you discuss any questions you have with your health care provider. Document Released: 10/02/2009 Document Revised: 05/13/2016 Document Reviewed: 01/10/2015 Elsevier Interactive Patient Education  2017 [employer].

## 2021-01-14 NOTE — Progress Notes (Signed)
Subjective:   William Richardson. is a 65 y.o. male who presents for Medicare Annual/Subsequent preventive examination.  Review of Systems: N/A      I connected with the patient today by telephone and verified that I am speaking with the correct person using two identifiers. Location patient: home Location nurse: work Persons participating in the telephone visit: patient, nurse.   I discussed the limitations, risks, security and privacy concerns of performing an evaluation and management service by telephone and the availability of in person appointments. I also discussed with the patient that there may be a patient responsible charge related to this service. The patient expressed understanding and verbally consented to this telephonic visit.        Cardiac Risk Factors include: advanced age (>24men, >8 women);male gender;Other (see comment), Risk factor comments: hyperlipidemia     Objective:    Today's Vitals   There is no height or weight on file to calculate BMI.  Advanced Directives 01/14/2021 11/18/2017  Does Patient Have a Medical Advance Directive? Yes Yes  Type of Paramedic of [place];Living will Living will  Copy of [place] in Chart? No - copy requested No - copy requested    Current Medications (verified) Outpatient Encounter Medications as of 01/14/2021  Medication Sig  . diphenhydrAMINE-APAP, sleep, (TYLENOL PM EXTRA STRENGTH PO) Take by mouth as needed.  . fluticasone (FLONASE) 50 MCG/ACT nasal spray Place 2 sprays into both nostrils daily.  Marland Kitchen guaiFENesin-codeine (ROBITUSSIN AC) 100-10 MG/5ML syrup Take 5-10 mLs by mouth at bedtime as needed for cough.  . meloxicam (MOBIC) 15 MG tablet Take 1 tablet by mouth once daily  . valACYclovir (VALTREX) 500 MG tablet Take by mouth as needed.   Marland Kitchen azithromycin (ZITHROMAX) 250 MG tablet 2 tab po x 1 day then 1 tab po daily (Patient not taking: Reported on 01/14/2021)    No facility-administered encounter medications on file as of 01/14/2021.    Allergies (verified) Patient has no known allergies.   History: Past Medical History:  Diagnosis Date  . Hypogonadism in male   . Seasonal allergies   . Sleep apnea    Past Surgical History:  Procedure Laterality Date  . COLONOSCOPY WITH PROPOFOL N/A 11/18/2017   Procedure: COLONOSCOPY WITH PROPOFOL;  Surgeon: Jonathon Bellows, MD;  Location: [facility];  Service: Gastroenterology;  Laterality: N/A;  . EYE SURGERY Bilateral    cataract  . ROTATOR CUFF REPAIR    . SPINE SURGERY     Family History  Problem Relation Age of Onset  . Dementia Mother   . Breast cancer Mother   . Emphysema Father   . Diabetes Brother    Social History   Socioeconomic History  . Marital status: Married    Spouse name: [name]  . Number of children: 1  . Years of education: high school  . Highest education level: Not on file  Occupational History  . Not on file  Tobacco Use  . Smoking status: Never Smoker  . Smokeless tobacco: Never Used  Vaping Use  . Vaping Use: Never used  Substance and Sexual Activity  . Alcohol use: No  . Drug use: No  . Sexual activity: Yes    Birth control/protection: Post-menopausal  Other Topics Concern  . Not on file  Social History Narrative   03/10/20   From: the area   Living: with wife Zettie Pho [phone])   Work: former Administrator      Family:  Lissa Hoard (Daughter), 3 grandchildren      Enjoys: vacation, travel      Exercise: not currently, needs to get back into the gym - has a Higher education careers adviser at [employer]   Diet: not great, skips breakfast, eats when hungry       Safety   Seat belts: Yes    Guns: Yes  and secure   Safe in relationships: Yes    Social Determinants of Health   Financial Resource Strain: Low Risk   . Difficulty of Paying Living Expenses: Not hard at all  Food Insecurity: No Food Insecurity  . Worried About Charity fundraiser in the Last Year: Never  true  . Ran Out of Food in the Last Year: Never true  Transportation Needs: No Transportation Needs  . Lack of Transportation (Medical): No  . Lack of Transportation (Non-Medical): No  Physical Activity: Inactive  . Days of Exercise per Week: 0 days  . Minutes of Exercise per Session: 0 min  Stress: No Stress Concern Present  . Feeling of Stress : Not at all  Social Connections: Not on file    Tobacco Counseling Counseling given: Not Answered   Clinical Intake:  Pre-visit preparation completed: Yes  Pain : No/denies pain     Nutritional Risks: None Diabetes: No  How often do you need to have someone help you when you read instructions, pamphlets, or other written materials from your doctor or pharmacy?: 1 - Never What is the last grade level you completed in school?: 12th  Diabetic: No Nutrition Risk Assessment:  Has the patient had any N/V/D within the last 2 months?  No  Does the patient have any non-healing wounds?  No  Has the patient had any unintentional weight loss or weight gain?  No   Diabetes:  Is the patient diabetic?  No  If diabetic, was a CBG obtained today?  N/A Did the patient bring in their glucometer from home?  N/A How often do you monitor your CBG's? N/A.   Financial Strains and Diabetes Management:  Are you having any financial strains with the device, your supplies or your medication? N/A.  Does the patient want to be seen by Chronic Care Management for management of their diabetes?  N/A Would the patient like to be referred to a Nutritionist or for Diabetic Management?  N/A    Interpreter Needed?: No  Information entered by :: CJohnson, LPN   Activities of Daily Living In your present state of health, do you have any difficulty performing the following activities: 01/14/2021  Hearing? N  Vision? N  Difficulty concentrating or making decisions? N  Walking or climbing stairs? N  Dressing or bathing? N  Doing errands, shopping? N   Preparing Food and eating ? N  Using the Toilet? N  In the past six months, have you accidently leaked urine? N  Do you have problems with loss of bowel control? N  Managing your Medications? N  Managing your Finances? N  Housekeeping or managing your Housekeeping? N  Some recent data might be hidden    Patient Care Team: Lesleigh Noe, MD as PCP - General (Family Medicine) Lucilla Lame, MD as Consulting Physician (Gastroenterology)  Indicate any recent Medical Services you may have received from other than [facility] providers in the past year (date may be approximate).     Assessment:   This is a routine wellness examination for [place].  Hearing/Vision screen  Hearing Screening   125Hz  250Hz   500Hz  1000Hz  2000Hz  3000Hz  4000Hz  6000Hz  8000Hz   Right ear:           Left ear:           Vision Screening Comments: Patient gets annual eye exams   Dietary issues and exercise activities discussed: Current Exercise Habits: The patient does not participate in regular exercise at present, Exercise limited by: None identified  Goals    . Patient Stated     01/14/2021, I will maintain and continue medications as prescribed      Depression Screen PHQ 2/9 Scores 01/14/2021 03/10/2020 01/22/2019 10/13/2017 10/07/2016  PHQ - 2 Score 0 0 0 0 0  PHQ- 9 Score 0 - 0 - 0    Fall Risk Fall Risk  01/14/2021 01/22/2019 10/13/2017  Falls in the past year? 0 0 No  Number falls in past yr: 0 0 -  Injury with Fall? 0 0 -  Risk for fall due to : No Fall Risks - -  Follow up Falls evaluation completed;Falls prevention discussed - -    FALL RISK PREVENTION PERTAINING TO THE HOME:  Any stairs in or around the home? Yes  If so, are there any without handrails? No  Home free of loose throw rugs in walkways, pet beds, electrical cords, etc? Yes  Adequate lighting in your home to reduce risk of falls? Yes   ASSISTIVE DEVICES UTILIZED TO PREVENT FALLS:  Life alert? No  Use of a cane, walker or w/c? No   Grab bars in the bathroom? No  Shower chair or bench in shower? No  Elevated toilet seat or a handicapped toilet? No   TIMED UP AND GO:  Was the test performed? N/A telephone visit.    Cognitive Function: MMSE - Mini Mental State Exam 01/14/2021  Orientation to time 5  Orientation to Place 5  Registration 3  Attention/ Calculation 5  Recall 3  Language- repeat 1       Mini Cog  Mini-Cog screen was completed. Maximum score is 22. A value of 0 denotes this part of the MMSE was not completed or the patient failed this part of the Mini-Cog screening.  Immunizations Immunization History  Administered Date(s) Administered  . Influenza,inj,Quad PF,6+ Mos 10/01/2015, 10/13/2017, 12/09/2018  . Pneumococcal Conjugate-13 03/10/2020  . Tdap 10/01/2015  . Zoster 01/22/2016    TDAP status: Up to date  Flu Vaccine status: Declined, Education has been provided regarding the importance of this vaccine but patient still declined. Advised may receive this vaccine at local pharmacy or Health Dept. Aware to provide a copy of the vaccination record if obtained from local pharmacy or Health Dept. Verbalized acceptance and understanding.  Pneumococcal vaccine status: Up to date  Covid-19 vaccine status: Declined, Education has been provided regarding the importance of this vaccine but patient still declined. Advised may receive this vaccine at local pharmacy or Health Dept.or vaccine clinic. Aware to provide a copy of the vaccination record if obtained from local pharmacy or Health Dept. Verbalized acceptance and understanding.  Qualifies for Shingles Vaccine? Yes   Zostavax completed Yes   Shingrix Completed?: No.    Education has been provided regarding the importance of this vaccine. Patient has been advised to call insurance company to determine out of pocket expense if they have not yet received this vaccine. Advised may also receive vaccine at local pharmacy or Health Dept. Verbalized  acceptance and understanding.  Screening Tests Health Maintenance  Topic Date Due  . COVID-19 Vaccine (1) 01/30/2021 (  Originally 12/15/1961)  . INFLUENZA VACCINE  03/19/2021 (Originally 07/20/2020)  . PNA vac Low Risk Adult (2 of 2 - PPSV23) 03/10/2021  . TETANUS/TDAP  09/30/2025  . COLONOSCOPY (Pts 45-66yrs Insurance coverage will need to be confirmed)  11/19/2027  . Hepatitis C Screening  Completed    Health Maintenance  There are no preventive care reminders to display for this patient.  Colorectal cancer screening: Type of screening: Colonoscopy. Completed 11/18/2017. Repeat every 10 years  Lung Cancer Screening: (Low Dose CT Chest recommended if Age 73-80 years, 30 pack-year currently smoking OR have quit w/in 15 years.) does not qualify.    Additional Screening:  Hepatitis C Screening: does qualify; Completed 01/22/2019  Vision Screening: Recommended annual ophthalmology exams for early detection of glaucoma and other disorders of the eye. Is the patient up to date with their annual eye exam?  Yes  Who is the provider or what is the name of the office in which the patient attends annual eye exams? Dr. Lorie Apley If pt is not established with a provider, would they like to be referred to a provider to establish care? No .   Dental Screening: Recommended annual dental exams for proper oral hygiene  Community Resource Referral / Chronic Care Management: CRR required this visit?  No   CCM required this visit?  No      Plan:     I have personally reviewed and noted the following in the patient's chart:   . Medical and social history . Use of alcohol, tobacco or illicit drugs  . Current medications and supplements . Functional ability and status . Nutritional status . Physical activity . Advanced directives . List of other physicians . Hospitalizations, surgeries, and ER visits in previous 12 months . Vitals . Screenings to include cognitive, depression, and  falls . Referrals and appointments  In addition, I have reviewed and discussed with patient certain preventive protocols, quality metrics, and best practice recommendations. A written personalized care plan for preventive services as well as general preventive health recommendations were provided to patient.   Due to this being a telephonic visit, the after visit summary with patients personalized plan was offered to patient via office or my-chart. Patient preferred to pick up at office at next visit or via mychart.   Andrez Grime, LPN   [id number]

## 2021-01-14 NOTE — Telephone Encounter (Signed)
Please let pt know - I prefer to order same day labs so that if anything comes up we make sure that we get all blood work that is necessary.

## 2021-01-14 NOTE — Progress Notes (Signed)
PCP notes:  Health Maintenance: Flu- declined Covid- declined    Abnormal Screenings: none   Patient concerns: none   Nurse concerns: none   Next PCP appt.: 01/21/2021 @ 9:20 am

## 2021-01-16 ENCOUNTER — Encounter: Payer: Self-pay | Admitting: Family Medicine

## 2021-01-17 ENCOUNTER — Encounter: Payer: Self-pay | Admitting: Family Medicine

## 2021-01-17 ENCOUNTER — Telehealth (INDEPENDENT_AMBULATORY_CARE_PROVIDER_SITE_OTHER): Payer: Medicare HMO | Admitting: Family Medicine

## 2021-01-17 DIAGNOSIS — U071 COVID-19: Secondary | ICD-10-CM

## 2021-01-17 NOTE — Telephone Encounter (Signed)
Please cancel and reschedule pt's upcoming appt. Thank you for all you do!   

## 2021-01-17 NOTE — Patient Instructions (Signed)
Based on your symptoms, it looks like you have a virus.   Antibiotics are not need for a viral infection but the following will help:   1. Drink plenty of fluids 2. Get lots of rest  Sinus Congestion 1) Neti Pot (Saline rinse) -- 2 times day -- if tolerated 2) Flonase (Store Brand ok) - once daily 3) Over the counter congestion medications  If you develop fevers (Temperature >100.4), chills, worsening symptoms or symptoms lasting longer than 10 days return to clinic.    

## 2021-01-17 NOTE — Progress Notes (Signed)
    I connected with [name] on 01/17/21 at 12:40 PM EST by video and verified that I am speaking with the correct person using two identifiers.   I discussed the limitations, risks, security and privacy concerns of performing an evaluation and management service by video and the availability of in person appointments. I also discussed with the patient that there may be a patient responsible charge related to this service. The patient expressed understanding and agreed to proceed.  Patient location: Home Provider Location: [place] [facility] Participants: Lesleigh Noe and Tokunaga Daniels Izell Marshall.   Subjective:     William Richardson. is a 65 y.o. male presenting for Covid Positive (Tested yesterday. Sx onset 01/15/21) and Sinusitis (Eye pressure )     Sinusitis This is a new problem. Episode onset: 01/15/2021. There has been no fever. Associated symptoms include sinus pressure and sneezing. Pertinent negatives include no chills, congestion, coughing, ear pain, shortness of breath or sore throat.   Headache the last 1-2 weeks Pressure behind the eyes  covid negatve on 1/24 - wife was positive He tested positive on 1/28  Not vaccinated for covid     Review of Systems  Constitutional: Negative for chills.  HENT: Positive for sinus pressure and sneezing. Negative for congestion, ear pain and sore throat.   Respiratory: Negative for cough and shortness of breath.      Social History   Tobacco Use  Smoking Status Never Smoker  Smokeless Tobacco Never Used        Objective:   BP Readings from Last 3 Encounters:  03/10/20 108/72  01/22/19 127/84  04/07/18 130/84   Wt Readings from Last 3 Encounters:  03/10/20 196 lb 4 oz (89 kg)  01/01/20 190 lb (86.2 kg)  01/22/19 181 lb 3 oz (82.2 kg)   There were no vitals taken for this visit.   Physical Exam Constitutional:      Appearance: Normal appearance. He is not ill-appearing.  HENT:      Head: Normocephalic and atraumatic.     Right Ear: External ear normal.     Left Ear: External ear normal.  Eyes:     Conjunctiva/sclera: Conjunctivae normal.  Pulmonary:     Effort: Pulmonary effort is normal. No respiratory distress.  Neurological:     Mental Status: He is alert. Mental status is at baseline.  Psychiatric:        Mood and Affect: Mood normal.        Behavior: Behavior normal.        Thought Content: Thought content normal.        Judgment: Judgment normal.             Assessment & Plan:   Problem List Items Addressed This Visit   None   Visit Diagnoses    COVID-19 virus infection    -  Primary     Discussed OTC treatment for viral illness Instructed to isolate until the results come back  If negative and symptoms persisting will call back for consideration for course of antibiotics    Return if symptoms worsen or fail to improve.  Lesleigh Noe, MD

## 2021-01-19 ENCOUNTER — Telehealth: Payer: Self-pay

## 2021-01-19 NOTE — Telephone Encounter (Signed)
[place] Night - Client Nonclinical Telephone Record  [facility] Client [place] Night - Client Client Site [place] Physician Waunita Schooner- MD Contact Type Call Who Is Calling Patient / Member / Family / Caregiver Caller Name Jennette Phone Number [phone] Patient Name CASMIR AUGUSTE Patient DOB [birth year] Call Type Message Only Information Provided Reason for Call Request to Reschedule Office Appointment Initial Comment Caller says she wants to reschedule her husbands appt due to him having Covid Disp. Time Disposition Final User 01/17/2021 10:32:31 AM General Information Provided Yes Adela Lank Call Closed By: Adela Lank Transaction Date/Time: 01/17/2021 10:29:12 AM (ET)  Patient was seen via video visit on 01/17/2021 with Dr. Einar Pheasant.

## 2021-01-19 NOTE — Telephone Encounter (Signed)
Pt rescheduled

## 2021-01-20 NOTE — Telephone Encounter (Signed)
Mychart message sent to pt relaying this information.

## 2021-01-21 ENCOUNTER — Ambulatory Visit: Payer: Medicare HMO | Admitting: Family Medicine

## 2021-01-23 ENCOUNTER — Encounter: Payer: Self-pay | Admitting: Family Medicine

## 2021-01-23 DIAGNOSIS — J0140 Acute pansinusitis, unspecified: Secondary | ICD-10-CM

## 2021-01-26 MED ORDER — AMOXICILLIN-POT CLAVULANATE 875-125 MG PO TABS: 1.0000 | ORAL_TABLET | Freq: Two times a day (BID) | ORAL | 0 refills | Status: AC

## 2021-01-26 MED ORDER — AZITHROMYCIN 250 MG PO TABS: ORAL_TABLET | ORAL | 0 refills | Status: DC

## 2021-01-26 NOTE — Addendum Note (Signed)
Addended by: Lesleigh Noe on: 01/26/2021 01:22 PM   Modules accepted: Orders

## 2021-01-26 NOTE — Addendum Note (Signed)
Addended by: Waunita Schooner R on: 01/26/2021 10:15 AM   Modules accepted: Orders

## 2021-02-08 DIAGNOSIS — G4730 Sleep apnea, unspecified: Secondary | ICD-10-CM | POA: Diagnosis not present

## 2021-02-08 DIAGNOSIS — G4733 Obstructive sleep apnea (adult) (pediatric): Secondary | ICD-10-CM | POA: Diagnosis not present

## 2021-02-24 ENCOUNTER — Other Ambulatory Visit: Payer: Self-pay

## 2021-02-24 ENCOUNTER — Ambulatory Visit (INDEPENDENT_AMBULATORY_CARE_PROVIDER_SITE_OTHER): Payer: Medicare HMO | Admitting: Family Medicine

## 2021-02-24 ENCOUNTER — Encounter: Payer: Self-pay | Admitting: Family Medicine

## 2021-02-24 VITALS — BP 100/80 | HR 59 | Temp 97.0°F | Ht 70.0 in | Wt 182.5 lb

## 2021-02-24 DIAGNOSIS — Z0000 Encounter for general adult medical examination without abnormal findings: Secondary | ICD-10-CM

## 2021-02-24 DIAGNOSIS — L309 Dermatitis, unspecified: Secondary | ICD-10-CM | POA: Insufficient documentation

## 2021-02-24 DIAGNOSIS — L600 Ingrowing nail: Secondary | ICD-10-CM | POA: Diagnosis not present

## 2021-02-24 DIAGNOSIS — E7800 Pure hypercholesterolemia, unspecified: Secondary | ICD-10-CM

## 2021-02-24 LAB — COMPREHENSIVE METABOLIC PANEL
ALT: 23 U/L (ref 0–53)
AST: 22 U/L (ref 0–37)
Albumin: 4.6 g/dL (ref 3.5–5.2)
Alkaline Phosphatase: 75 U/L (ref 39–117)
BUN: 13 mg/dL (ref 6–23)
CO2: 32 mEq/L (ref 19–32)
Calcium: 9.9 mg/dL (ref 8.4–10.5)
Chloride: 103 mEq/L (ref 96–112)
Creatinine, Ser: 1.1 mg/dL (ref 0.40–1.50)
GFR: 70.11 mL/min (ref >60.00)
Glucose, Bld: 89 mg/dL (ref 70–99)
Potassium: 5.1 mEq/L (ref 3.5–5.1)
Sodium: 139 mEq/L (ref 135–145)
Total Bilirubin: 0.8 mg/dL (ref 0.2–1.2)
Total Protein: 7.3 g/dL (ref 6.0–8.3)

## 2021-02-24 LAB — LIPID PANEL
Cholesterol: 162 mg/dL (ref 0–200)
HDL: 49.4 mg/dL (ref >39.00)
LDL Cholesterol: 87 mg/dL (ref 0–99)
NonHDL: 112.7
Total CHOL/HDL Ratio: 3
Triglycerides: 130 mg/dL (ref 0.0–149.0)
VLDL: 26 mg/dL (ref 0.0–40.0)

## 2021-02-24 MED ORDER — TRIAMCINOLONE ACETONIDE 0.1 % EX CREA: 1.0000 "application " | TOPICAL_CREAM | Freq: Two times a day (BID) | CUTANEOUS | 0 refills | Status: DC

## 2021-02-24 NOTE — Progress Notes (Signed)
Annual Exam   Chief Complaint:  Chief Complaint  Patient presents with  . Medicare Wellness    History of Present Illness:  William Richardson. is a 65 y.o. presents today for annual examination.    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  Any issues with getting or keeping erection? No  Social History   Tobacco Use  Smoking Status Never Smoker  Smokeless Tobacco Never Used   Social History   Substance and Sexual Activity  Alcohol Use No   Social History   Substance and Sexual Activity  Drug Use No     Safety The patient wears seatbelts: yes.     The patient feels safe at home and in their relationships: yes.  General Health Dentist in the last year: Yes Eye doctor: yes  Weight Wt Readings from Last 3 Encounters:  02/24/21 182 lb 8 oz (82.8 kg)  03/10/20 196 lb 4 oz (89 kg)  01/01/20 190 lb (86.2 kg)   Patient has high BMI  BMI Readings from Last 1 Encounters:  02/24/21 26.19 kg/m     Chronic disease screening Blood pressure monitoring:  BP Readings from Last 3 Encounters:  02/24/21 100/80  03/10/20 108/72  01/22/19 127/84    Lipid Monitoring: Indication for screening: age >35, obesity, diabetes, family hx, CV risk factors.  Lipid screening: Yes  Lab Results  Component Value Date   CHOL 162 03/12/2020   HDL 43.20 03/12/2020   LDLCALC 94 03/12/2020   TRIG 123.0 03/12/2020   CHOLHDL 4 03/12/2020     Diabetes Screening: age >69, overweight, family hx, PCOS, hx of gestational diabetes, at risk ethnicity, elevated blood pressure >135/80.  Diabetes Screening screening: Yes  No results found for: HGBA1C   Prostate Cancer Screening: Yes Age 29-69 yo Shared Decision Making Higher  Risk: Older age, African American, Family Hx of Prostate Cancer - No Benefits: screening may prevent 1.3 deaths from prostate cancer over 13 years per 1000 men screened and prevent 3 metastatic cases per 1000 men screened. Not enough evidence to support more benefit for AA or [place] Harms: False Positive and psychological harms. 15% of me with false positive over a 2 to 4 year period > resulting in biopsy and complications such as pain, hematospermia, infections. Overdiagnosis - increases with age - found that 20-50% of prostate cancer through screening may have never caused any issues. Harms of treatment include - erectile dysfunction, urinary incontinence, and bothersome bowel symptoms.   After discussion he does not want to get a PSA checked today.   Inadequate evidence for screening <55 No mortality benefit for screening >70   Lab Results  Component Value Date   PSA1 1.8 01/22/2019       Colon Cancer Screening:  Age 67-75 yo - benefits outweigh the risk. Adults 50-85 yo who have never been screened benefit.  Benefits: 134000 people in 2016 will be diagnosed and 49,000 will die - early detection helps Harms: Complications 2/2 to colonoscopy High Risk (Colonoscopy): genetic disorder (Lynch syndrome or familial adenomatous polyposis), personal hx of IBD, previous adenomatous polyp, or previous colorectal cancer, FamHx start 10 years before the age at diagnosis, increased in males and black race  Options:  FIT - looks for hemoglobin (  blood in the stool) - specific and fairly sensitive - must be done annually Cologuard - looks for DNA and blood - more sensitive - therefore can have more false positives, every 3 years Colonoscopy - every 10 years if normal - sedation, bowl prep, must have someone drive you  Shared decision making and the patient had decided to do colonoscopy 2018 for 10 years.   Social History   Tobacco Use  Smoking Status Never Smoker  Smokeless Tobacco Never Used     Lung Cancer Screening (Ages 93-26): not applicable     Past Medical History:  Diagnosis Date  . Hypogonadism in male   . Seasonal allergies   . Sleep apnea     Past Surgical History:  Procedure Laterality Date  . COLONOSCOPY WITH PROPOFOL N/A 11/18/2017   Procedure: COLONOSCOPY WITH PROPOFOL;  Surgeon: Jonathon Bellows, MD;  Location: [facility] ENDOSCOPY;  Service: Gastroenterology;  Laterality: N/A;  . EYE SURGERY Bilateral    cataract  . ROTATOR CUFF REPAIR    . SPINE SURGERY      Prior to Admission medications   Medication Sig Start Date End Date Taking? Authorizing Provider  diphenhydrAMINE-APAP, sleep, (TYLENOL PM EXTRA STRENGTH PO) Take by mouth as needed.    [provider]  fluticasone (FLONASE) 50 MCG/ACT nasal spray Place 2 sprays into both nostrils daily. 10/01/15   Guadalupe Maple, MD  meloxicam (MOBIC) 15 MG tablet Take 1 tablet by mouth once daily Patient taking differently: Take 15 mg by mouth as needed. 11/17/20   Lesleigh Noe, MD  valACYclovir (VALTREX) 500 MG tablet Take by mouth as needed.  07/30/19   [provider]    No Known Allergies   Social History   Socioeconomic History  . Marital status: Married    Spouse name: [name]  . Number of children: 1  . Years of education: high school  . Highest education level: Not on file  Occupational History  . Not on file  Tobacco Use  . Smoking status: Never Smoker  . Smokeless tobacco: Never Used  Vaping Use  . Vaping Use: Never used  Substance and Sexual Activity  . Alcohol use: No  . Drug use: No  . Sexual activity: Yes    Birth control/protection: Post-menopausal  Other Topics Concern  . Not on file  Social History Narrative   03/10/20   From: the area   Living: with wife [name] [phone])   Work: former Administrator      Family: Health visitor (Daughter), 3 grandchildren      Enjoys: vacation, travel      Exercise: not currently, needs to get back into the gym - has a  Higher education careers adviser at [employer]   Diet: not great, skips breakfast, eats when hungry       Safety   Seat belts: Yes    Guns: Yes  and secure   Safe in relationships: Yes    Social Determinants of Health   Financial Resource Strain: Low Risk   . Difficulty of Paying Living Expenses: Not hard at all  Food Insecurity: No Food Insecurity  . Worried About Charity fundraiser in the Last Year: Never true  . Ran Out of Food in the Last Year: Never true  Transportation Needs: No Transportation Needs  . Lack of Transportation (Medical): No  . Lack of Transportation (Non-Medical): No  Physical Activity: Inactive  . Days of Exercise per Week: 0 days  . Minutes of Exercise per  Session: 0 min  Stress: No Stress Concern Present  . Feeling of Stress : Not at all  Social Connections: Not on file  Intimate Partner Violence: Not At Risk  . Fear of Current or Ex-Partner: No  . Emotionally Abused: No  . Physically Abused: No  . Sexually Abused: No    Family History  Problem Relation Age of Onset  . Dementia Mother   . Breast cancer Mother   . Emphysema Father   . Diabetes Brother     Review of Systems  Constitutional: Negative for chills and fever.  HENT: Negative for congestion and sore throat.   Eyes: Negative for blurred vision and double vision.  Respiratory: Negative for shortness of breath.   Cardiovascular: Negative for chest pain.  Gastrointestinal: Negative for heartburn, nausea and vomiting.  Genitourinary: Negative.   Musculoskeletal: Negative.  Negative for myalgias.  Skin: Positive for rash. Negative for itching.  Neurological: Negative for dizziness and headaches.  Endo/Heme/Allergies: Does not bruise/bleed easily.  Psychiatric/Behavioral: Negative for depression. The patient is not nervous/anxious.      Physical Exam BP 100/80   Pulse (!) 59   Temp (!) 97 F (36.1 C) (Temporal)   Ht 5\' 10"  (1.778 m)   Wt 182 lb 8 oz (82.8 kg)   SpO2 97%   BMI 26.19 kg/m    BP  Readings from Last 3 Encounters:  02/24/21 100/80  03/10/20 108/72  01/22/19 127/84      Physical Exam Constitutional:      General: He is not in acute distress.    Appearance: He is well-developed and well-nourished. He is not diaphoretic.  HENT:     Head: Normocephalic and atraumatic.     Right Ear: Tympanic membrane and ear canal normal.     Left Ear: Tympanic membrane and ear canal normal.     Ears:     Comments: Right external ear with scaly erythematous rash.     Nose: Nose normal.     Mouth/Throat:     Mouth: Oropharynx is clear and moist.     Pharynx: Uvula midline.  Eyes:     General: No scleral icterus.    Extraocular Movements: EOM normal.     Conjunctiva/sclera: Conjunctivae normal.     Pupils: Pupils are equal, round, and reactive to light.  Cardiovascular:     Rate and Rhythm: Normal rate and regular rhythm.     Heart sounds: Normal heart sounds. No murmur heard.   Pulmonary:     Effort: Pulmonary effort is normal. No respiratory distress.     Breath sounds: Normal breath sounds. No wheezing.  Abdominal:     General: Bowel sounds are normal. There is no distension.     Palpations: Abdomen is soft. There is no mass.     Tenderness: There is no abdominal tenderness. There is no guarding.  Musculoskeletal:        General: Normal range of motion.     Cervical back: Normal range of motion and neck supple.  Lymphadenopathy:     Cervical: No cervical adenopathy.  Skin:    General: Skin is warm and dry.     Capillary Refill: Capillary refill takes less than 2 seconds.  Neurological:     Mental Status: He is alert and oriented to person, place, and time.  Psychiatric:        Mood and Affect: Mood and affect normal.        Results:  PHQ-9:  Flowsheet Row Clinical  Support from 01/14/2021 in [place] at [place]  PHQ-9 Total Score 0        Assessment: 65 y.o. here for routine annual physical examination.  Plan: Problem List Items  Addressed This Visit      Musculoskeletal and Integument   Eczema    Suspect eczema vs scalp psoriasis. Discussed trial of steroid cream twice daily.       Relevant Medications   triamcinolone (KENALOG) 0.1 %     Other   Hyperlipidemia    Intermittently elevated. ASCVD >7.5% Discussed increased risk and benefit from statin. Discussed option of coronary CA score testing. He will consider in the future. Will repeat lipids and consider treatment in the future.       Relevant Orders   Comprehensive metabolic panel   Lipid panel    Other Visit Diagnoses    Encounter for annual physical exam    -  Primary   Ingrown toenail of left foot       Relevant Orders   Ambulatory referral to Podiatry      Screening: -- Blood pressure screen normal -- cholesterol screening: will obtain -- Weight screening: overweight: continue to monitor -- Diabetes Screening: will obtain -- Nutrition: normal - Encouraged healthy diet  The 10-year ASCVD risk score Mikey Bussing DC Jr., et al., 2013) is: 8.6%   Values used to calculate the score:     Age: 89 years     Sex: Male     Is Non-Hispanic African American: No     Diabetic: No     Tobacco smoker: No     Systolic Blood Pressure: 329 mmHg     Is BP treated: No     HDL Cholesterol: 43.2 mg/dL     Total Cholesterol: 162 mg/dL  -- ASA 81 mg discussed if CVD risk >10% age 51-59 and willing to take for 10 years -- Statin therapy for Age 66-75 with CVD risk >7.5%  Psych -- Depression screening (PHQ-9): negative  Safety -- tobacco screening: not using -- alcohol screening:  low-risk usage. -- no evidence of domestic violence or intimate partner violence.  Cancer Screening -- Prostate (age 17-69) declined -- Colon (age 61-75) due in 2028 -- Lung not indicated   Immunizations Immunization History  Administered Date(s) Administered  . Influenza,inj,Quad PF,6+ Mos 10/01/2015, 10/13/2017, 12/09/2018  . Pneumococcal Conjugate-13 03/10/2020  . Tdap  10/01/2015  . Zoster 01/22/2016    -- flu vaccine up to date -- TDAP q10 years up to date -- Shingles (age >65) encouraged getting at the pharmacy -- PPSV-23 (19-64 with chronic disease or smoking) due in 2 weeks, he will return -- PCV-13 (age >37) - one dose followed by PPSV-23 1 year later up to date -- Covid-19 Vaccine declined s/p infection  Encouraged regular vision and dental screening. Encouraged healthy exercise and diet.   Lesleigh Noe

## 2021-02-24 NOTE — Patient Instructions (Signed)
#  Referral I have placed a referral to a specialist for you. You should receive a phone call from the specialty office. Make sure your voicemail is not full and that if you are able to answer your phone to unknown or new numbers.   It may take up to 2 weeks to hear about the referral. If you do not hear anything in 2 weeks, please call our office and ask to speak with the referral coordinator.    Triamcinolone for you ear  Return after 3/22 for pneumonia shot

## 2021-02-24 NOTE — Assessment & Plan Note (Signed)
Suspect eczema vs scalp psoriasis. Discussed trial of steroid cream twice daily.

## 2021-02-24 NOTE — Assessment & Plan Note (Signed)
Intermittently elevated. ASCVD >7.5% Discussed increased risk and benefit from statin. Discussed option of coronary CA score testing. He will consider in the future. Will repeat lipids and consider treatment in the future.

## 2021-02-27 ENCOUNTER — Telehealth: Payer: Self-pay | Admitting: Family Medicine

## 2021-02-27 ENCOUNTER — Telehealth: Payer: Self-pay | Admitting: *Deleted

## 2021-02-27 NOTE — Telephone Encounter (Signed)
Called patient to schedule from referral, no answer, LVM for patient to return call to get appointment scheduled---[place] Referral

## 2021-02-27 NOTE — Telephone Encounter (Signed)
Patient is requesting appointment for a possible ingrown toenail. Please contact.[phone].

## 2021-03-02 ENCOUNTER — Other Ambulatory Visit: Payer: Self-pay

## 2021-03-02 ENCOUNTER — Encounter: Payer: Self-pay | Admitting: Podiatry

## 2021-03-02 ENCOUNTER — Ambulatory Visit: Payer: Medicare HMO | Admitting: Podiatry

## 2021-03-02 DIAGNOSIS — L600 Ingrowing nail: Secondary | ICD-10-CM | POA: Diagnosis not present

## 2021-03-02 MED ORDER — NEOMYCIN-POLYMYXIN-HC 1 % OT SOLN: OTIC | 1 refills | Status: DC

## 2021-03-02 NOTE — Progress Notes (Signed)
Subjective:  Patient ID: William Numbers., male    DOB: [birth year],  MRN: [id number] HPI Chief Complaint  Patient presents with  . Ingrown Toenail    Patient presents today for ingrown toenail right hallux medial border off and on x 1 year.  He says it tender touch now    65 y.o. male presents with the above complaint.   ROS: Denies fever chills nausea vomiting muscle aches pains calf pain back pain chest pain shortness of breath.  Past Medical History:  Diagnosis Date  . Hypogonadism in male   . Seasonal allergies   . Sleep apnea    Past Surgical History:  Procedure Laterality Date  . COLONOSCOPY WITH PROPOFOL N/A 11/18/2017   Procedure: COLONOSCOPY WITH PROPOFOL;  Surgeon: Jonathon Bellows, MD;  Location: [facility] ENDOSCOPY;  Service: Gastroenterology;  Laterality: N/A;  . EYE SURGERY Bilateral    cataract  . ROTATOR CUFF REPAIR    . SPINE SURGERY      Current Outpatient Medications:  .  NEOMYCIN-POLYMYXIN-HYDROCORTISONE (CORTISPORIN) 1 % SOLN OTIC solution, Apply 1-2 drops to toe BID after soaking, Disp: 10 mL, Rfl: 1 .  diphenhydrAMINE-APAP, sleep, (TYLENOL PM EXTRA STRENGTH PO), Take by mouth as needed., Disp: , Rfl:  .  fluticasone (FLONASE) 50 MCG/ACT nasal spray, Place 2 sprays into both nostrils daily. (Patient taking differently: Place 2 sprays into both nostrils as needed.), Disp: 3 g, Rfl: 12 .  meloxicam (MOBIC) 15 MG tablet, Take 1 tablet by mouth once daily (Patient taking differently: Take 15 mg by mouth as needed.), Disp: 30 tablet, Rfl: 0 .  triamcinolone (KENALOG) 0.1 %, Apply 1 application topically 2 (two) times daily., Disp: 30 g, Rfl: 0 .  valACYclovir (VALTREX) 500 MG tablet, Take by mouth as needed. , Disp: , Rfl:   No Known Allergies Review of Systems Objective:  There were no vitals filed for this visit.  General: Well developed, nourished, in no acute distress, alert and oriented x3   Dermatological: Skin is warm, dry and supple bilateral.  Nails x 10 are well maintained; remaining integument appears unremarkable at this time. There are no open sores, no preulcerative lesions, no rash or signs of infection present.  Ingrown toenail tibial border hallux right.  Positive erythema no edema cellulitis drainage or odor.  Vascular: Dorsalis Pedis artery and Posterior Tibial artery pedal pulses are 2/4 bilateral with immedate capillary fill time. Pedal hair growth present. No varicosities and no lower extremity edema present bilateral.   Neruologic: Grossly intact via light touch bilateral. Vibratory intact via tuning fork bilateral. Protective threshold with Semmes Wienstein monofilament intact to all pedal sites bilateral. Patellar and Achilles deep tendon reflexes 2+ bilateral. No Babinski or clonus noted bilateral.   Musculoskeletal: No gross boney pedal deformities bilateral. No pain, crepitus, or limitation noted with foot and ankle range of motion bilateral. Muscular strength 5/5 in all groups tested bilateral.  Gait: Unassisted, Nonantalgic.    Radiographs:  None  Assessment & Plan:   Assessment: Ingrown toenail tibial border hallux right foot.  States that this been going on for at least a year now.  Plan: Partial nail avulsion medial border hallux right after local anesthetic was administered.  Tolerated procedure well no complications.  Provide him both oral and written home-going instructions for the care and soaking of the toe as well as a prescription for Cortisporin Otic to be applied twice daily after soaking.  Follow-up with him in 1 to 2 weeks.  Garrel Ridgel, DPM

## 2021-03-02 NOTE — Patient Instructions (Signed)

## 2021-03-05 ENCOUNTER — Telehealth: Payer: Self-pay

## 2021-03-05 NOTE — Telephone Encounter (Signed)
Pt called and stated that the prescribed medication was too expensive and would like to have something different called in. Please advise.

## 2021-03-05 NOTE — Telephone Encounter (Signed)
L/M - advised patient to use triple antibiotic ointment

## 2021-03-05 NOTE — Telephone Encounter (Signed)
good

## 2021-03-08 DIAGNOSIS — G4733 Obstructive sleep apnea (adult) (pediatric): Secondary | ICD-10-CM | POA: Diagnosis not present

## 2021-03-08 DIAGNOSIS — G4730 Sleep apnea, unspecified: Secondary | ICD-10-CM | POA: Diagnosis not present

## 2021-03-18 ENCOUNTER — Encounter: Payer: Self-pay | Admitting: Podiatry

## 2021-03-18 ENCOUNTER — Ambulatory Visit: Payer: Medicare HMO | Admitting: Podiatry

## 2021-03-18 ENCOUNTER — Other Ambulatory Visit: Payer: Self-pay

## 2021-03-18 DIAGNOSIS — L03032 Cellulitis of left toe: Secondary | ICD-10-CM | POA: Diagnosis not present

## 2021-03-18 MED ORDER — CLINDAMYCIN HCL 150 MG PO CAPS: 150.0000 mg | ORAL_CAPSULE | Freq: Three times a day (TID) | ORAL | 1 refills | Status: DC

## 2021-03-18 NOTE — Progress Notes (Signed)
He presents today for follow-up of his nail avulsion fibular border hallux left.  States is a little red and sore still been soaking it every other day.  Objective: Vital signs stable alert oriented x3 pulses are palpable.  At this point he does demonstrate erythema along the proximal nail fold no purulence no malodor tender to touch.  Assessment: Paronychia status post nail avulsion fibular border.  Plan: At this point start him on clindamycin discontinue Cortisporin Otic discontinue Betadine soaks start with Epson salts and warm water soaks daily.  Follow-up with me in 2 weeks

## 2021-03-30 ENCOUNTER — Encounter: Payer: Self-pay | Admitting: Family Medicine

## 2021-03-30 MED ORDER — MELOXICAM 15 MG PO TABS: 15.0000 mg | ORAL_TABLET | ORAL | 0 refills | Status: DC | PRN

## 2021-04-01 ENCOUNTER — Ambulatory Visit: Payer: Medicare HMO | Admitting: Podiatry

## 2021-04-02 DIAGNOSIS — G4733 Obstructive sleep apnea (adult) (pediatric): Secondary | ICD-10-CM | POA: Diagnosis not present

## 2021-04-02 DIAGNOSIS — G4730 Sleep apnea, unspecified: Secondary | ICD-10-CM | POA: Diagnosis not present

## 2021-04-08 DIAGNOSIS — G4730 Sleep apnea, unspecified: Secondary | ICD-10-CM | POA: Diagnosis not present

## 2021-04-08 DIAGNOSIS — G4733 Obstructive sleep apnea (adult) (pediatric): Secondary | ICD-10-CM | POA: Diagnosis not present

## 2021-04-21 ENCOUNTER — Encounter: Payer: Self-pay | Admitting: Family Medicine

## 2021-04-22 ENCOUNTER — Telehealth: Payer: Self-pay | Admitting: Family Medicine

## 2021-04-22 NOTE — Telephone Encounter (Signed)
Spoke with patient who stated that he got bit by a tick yesterday evening. He stated that his wife got the ticks head out and he has saved it. Patient reported having redness around the site, but denied swelling, fever, or any other acute symptoms. Patient has an appointment tomorrow (5/5) with Dr. Einar Pheasant. Instructed patient that if symptoms got worse to call office back. Patient verbalized understanding.

## 2021-04-22 NOTE — Telephone Encounter (Signed)
[place] Night - Client Nonclinical Telephone Record  [facility] Client [place] Night - Client Client Site [place] Physician Waunita Schooner- MD Contact Type Call Who Is Calling Patient / Member / Family / Caregiver Caller Name William Richardson Caller Phone Number [phone] Patient Name William Richardson Patient DOB 04-08-1956 Call Type Message Only Information Provided Reason for Call Request to Schedule Office Appointment Initial Comment Caller got bit by a lonestar tick, and was told to make an appt. with Dr. Einar Pheasant to get checked out. Patient request to speak to RN No Additional Comment Caller declined triage. I provided office hours. Disp. Time Disposition Final User 04/21/2021 5:20:55 PM General Information Provided Yes Arizona Constable Call Closed By: [place] Constable Transaction Date/Time: 04/21/2021 5:18:09 PM (ET)

## 2021-04-22 NOTE — Telephone Encounter (Signed)
Pt called in wanted to know if he can get antibiotic for a tick bite and he stated that once it gets up the leg its harder to fight it. He does have an appointment tomorrow @1020    Please advise

## 2021-04-22 NOTE — Telephone Encounter (Signed)
Noted will see tomorrow 

## 2021-04-23 ENCOUNTER — Other Ambulatory Visit: Payer: Self-pay

## 2021-04-23 ENCOUNTER — Ambulatory Visit (INDEPENDENT_AMBULATORY_CARE_PROVIDER_SITE_OTHER): Payer: Medicare HMO | Admitting: Family Medicine

## 2021-04-23 ENCOUNTER — Encounter: Payer: Self-pay | Admitting: Family Medicine

## 2021-04-23 VITALS — BP 108/78 | HR 77 | Temp 97.4°F | Ht 70.0 in | Wt 183.2 lb

## 2021-04-23 DIAGNOSIS — S70262A Insect bite (nonvenomous), left hip, initial encounter: Secondary | ICD-10-CM

## 2021-04-23 DIAGNOSIS — W57XXXA Bitten or stung by nonvenomous insect and other nonvenomous arthropods, initial encounter: Secondary | ICD-10-CM | POA: Diagnosis not present

## 2021-04-23 DIAGNOSIS — Z9189 Other specified personal risk factors, not elsewhere classified: Secondary | ICD-10-CM | POA: Diagnosis not present

## 2021-04-23 MED ORDER — DOXYCYCLINE HYCLATE 100 MG PO TABS: 200.0000 mg | ORAL_TABLET | Freq: Once | ORAL | 0 refills | Status: AC

## 2021-04-23 NOTE — Patient Instructions (Signed)
In order to need antibiotics following a Tick bite you must have the following:   .Attached tick is identified as an adult or nymphal I. scapularis tick (deer tick).  .Tick is estimated to have been attached for ?36 hours based on degree of engorgement or time of exposure. Marland KitchenProphylaxis is begun within 72 hours of tick removal.   You will take doxycycline 200 mg one time

## 2021-04-23 NOTE — Progress Notes (Signed)
   Subjective:     William Richardson. is a 65 y.o. male presenting for Tick Removal (L hip. May have gotten it all out )     HPI   #Tick bite - [place] tick - on 04/21/2021 - wife removed most of it  - did yard work on 04/20/2021 (Monday)  - took shower on Tuesday AM 04/21/2021 - cannot remember what he did on this day -   Review of Systems   Social History   Tobacco Use  Smoking Status Never Smoker  Smokeless Tobacco Never Used        Objective:    BP Readings from Last 3 Encounters:  04/23/21 108/78  02/24/21 100/80  03/10/20 108/72   Wt Readings from Last 3 Encounters:  04/23/21 183 lb 4 oz (83.1 kg)  02/24/21 182 lb 8 oz (82.8 kg)  03/10/20 196 lb 4 oz (89 kg)    BP 108/78   Pulse 77   Temp (!) 97.4 F (36.3 C) (Temporal)   Ht 5\' 10"  (1.778 m)   Wt 183 lb 4 oz (83.1 kg)   SpO2 97%   BMI 26.29 kg/m    Physical Exam Constitutional:      Appearance: Normal appearance. He is not ill-appearing or diaphoretic.  HENT:     Right Ear: External ear normal.     Left Ear: External ear normal.  Eyes:     General: No scleral icterus.    Extraocular Movements: Extraocular movements intact.     Conjunctiva/sclera: Conjunctivae normal.  Cardiovascular:     Rate and Rhythm: Normal rate.  Pulmonary:     Effort: Pulmonary effort is normal.  Musculoskeletal:     Cervical back: Neck supple.  Skin:    General: Skin is warm and dry.     Comments: Left hip with erythematous lesion without any black discoloration to suggest remaining tick portions. Small bite wound. No surrounding rash on other body parts.   Neurological:     Mental Status: He is alert. Mental status is at baseline.  Psychiatric:        Mood and Affect: Mood normal.           Assessment & Plan:   Problem List Items Addressed This Visit   None   Visit Diagnoses    Tick bite of left hip, initial encounter    -  Primary   Relevant Medications   doxycycline (VIBRA-TABS) 100 MG  tablet   Risk of exposure to Lyme disease       Relevant Medications   doxycycline (VIBRA-TABS) 100 MG tablet     Unknown length of tick attachment but at least 24 hours?? Given poor history and engorgement per patient report will treat with prophylaxis doxycycline  Advised monitoring for rashes and worsening symptoms and return if present  Return if symptoms worsen or fail to improve.  Lesleigh Noe, MD  This visit occurred during the SARS-CoV-2 public health emergency.  Safety protocols were in place, including screening questions prior to the visit, additional usage of staff PPE, and extensive cleaning of exam room while observing appropriate contact time as indicated for disinfecting solutions.

## 2021-05-02 DIAGNOSIS — G4730 Sleep apnea, unspecified: Secondary | ICD-10-CM | POA: Diagnosis not present

## 2021-05-02 DIAGNOSIS — G4733 Obstructive sleep apnea (adult) (pediatric): Secondary | ICD-10-CM | POA: Diagnosis not present

## 2021-05-08 ENCOUNTER — Telehealth: Payer: Self-pay | Admitting: Podiatry

## 2021-05-08 ENCOUNTER — Telehealth: Payer: Self-pay | Admitting: *Deleted

## 2021-05-08 MED ORDER — CEPHALEXIN 500 MG PO CAPS: 500.0000 mg | ORAL_CAPSULE | Freq: Three times a day (TID) | ORAL | 0 refills | Status: DC

## 2021-05-08 NOTE — Telephone Encounter (Signed)
"  Dr. Milinda Pointer removed part of my toenail a couple of months ago.  It's red down in the corner where he removed the root."  Is there any drainage?  "Yes, there was last night, a little bit.  Does he want to give me another round of antibiotics?"  I'll send a message to [facility], Dr. Stephenie Acres nurse."

## 2021-05-08 NOTE — Addendum Note (Signed)
Addended bySherryle Lis, Sender Rueb R on: 05/08/2021 11:34 AM   Modules accepted: Orders

## 2021-05-08 NOTE — Telephone Encounter (Signed)
Pt states he had an ingrown removed a few months ago and believes the nail may be infected. Please advise.

## 2021-05-09 ENCOUNTER — Other Ambulatory Visit: Payer: Self-pay

## 2021-05-09 ENCOUNTER — Ambulatory Visit
Admission: EM | Admit: 2021-05-09 | Discharge: 2021-05-09 | Disposition: A | Discharge status: Home / Self Care | Payer: Medicare HMO | Attending: Emergency Medicine | Admitting: Emergency Medicine

## 2021-05-09 ENCOUNTER — Encounter: Payer: Self-pay | Admitting: Family Medicine

## 2021-05-09 DIAGNOSIS — A6920 Lyme disease, unspecified: Secondary | ICD-10-CM

## 2021-05-09 DIAGNOSIS — S70362A Insect bite (nonvenomous), left thigh, initial encounter: Secondary | ICD-10-CM

## 2021-05-09 DIAGNOSIS — W57XXXA Bitten or stung by nonvenomous insect and other nonvenomous arthropods, initial encounter: Secondary | ICD-10-CM

## 2021-05-09 MED ORDER — DOXYCYCLINE HYCLATE 100 MG PO CAPS: 100.0000 mg | ORAL_CAPSULE | Freq: Two times a day (BID) | ORAL | 0 refills | Status: AC

## 2021-05-09 NOTE — ED Provider Notes (Signed)
[facility]    CSN: [id number] Arrival date & time: 05/09/21  1132      History   Chief Complaint Chief Complaint  Patient presents with  . Tick Bite         HPI William Richardson. is a 65 y.o. male.   HPI   65 year old male here for evaluation of rash.  Patient reports that he pulled a tick off of his left thigh 10 to 11 days ago and then noticed the rash this morning.  He has a bull's-eye on his left thigh.  He states he has had some intermittent joint pain but is not more than usual.  He denies fever or headaches.  Past Medical History:  Diagnosis Date  . Hypogonadism in male   . Seasonal allergies   . Sleep apnea     Patient Active Problem List   Diagnosis Date Noted  . Eczema 02/24/2021  . Cough 09/04/2020  . Chronic shoulder pain 03/10/2020  . 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 Hyperlipidemia 03/10/2020  . Sleep apnea 03/10/2020  . Hypogonadism in male 10/01/2015    Past Surgical History:  Procedure Laterality Date  . COLONOSCOPY WITH PROPOFOL N/A 11/18/2017   Procedure: COLONOSCOPY WITH PROPOFOL;  Surgeon: Jonathon Bellows, MD;  Location[facility] ENDOSCOPY;  Service: Gastroenterology;  Laterality: N/A;  . EYE SURGERY Bilateral    cataract  . ROTATOR CUFF REPAIR    . SPINE SURGERY         Home Medications    Prior to Admission medications   Medication Sig Start Date End Date Taking? Authorizing Provider  diphenhydrAMINE-APAP, sleep, (TYLENOL PM EXTRA STRENGTH PO) Take by mouth as needed.   Yes [provider]  doxycycline (VIBRAMYCIN) 100 MG capsule Take 1 capsule (100 mg total) by mouth 2 (two) times daily for 28 days. 05/09/21 06/06/21 Yes Margarette Canada, NP  fluticasone [facility]) 50 MCG/ACT nasal spray Place 2 sprays into both nostrils daily. Patient taking differently: Place 2 sprays into both nostrils as needed. 10/01/15  Yes Crissman, Jeannette How, MD  meloxicam (MOBIC) 15 MG tablet Take 1 tablet (15 mg total) by mouth  as needed. 03/30/21  Yes Lesleigh Noe, MD  valACYclovir (VALTREX) 500 MG tablet Take by mouth as needed.  07/30/19  Yes [provider]    Family History Family History  Problem Relation Age of Onset  . Dementia Mother   . Breast cancer Mother   . Emphysema Father   . Diabetes Brother     Social History Social History   Tobacco Use  . Smoking status: Never Smoker  . Smokeless tobacco: Never Used  Vaping Use  . Vaping Use: Never used  Substance Use Topics  . Alcohol use: No  . Drug use: No     Allergies   Patient has no known allergies.   Review of Systems Review of Systems  Constitutional: Negative for activity change and fever.  Musculoskeletal: Positive for arthralgias.  Skin: Positive for rash.  Neurological: Negative for headaches.  Hematological: Negative.   Psychiatric/Behavioral: Negative.      Physical Exam Triage Vital Signs ED Triage Vitals  Enc Vitals Group     BP 05/09/21 1208 (!) 142/86     Pulse Rate 05/09/21 1208 61     Resp 05/09/21 1208 17     Temp 05/09/21 1208 98.6 F (37 C)     Temp Source 05/09/21 1208 Oral     SpO2 05/09/21 1208 99 %  Weight 05/09/21 1206 185 lb (83.9 kg)     Height 05/09/21 1206 5\' 10"  (1.778 m)     Head Circumference --      Peak Flow --      Pain Score 05/09/21 1206 1     Pain Loc --      Pain Edu? --      Excl. in [place]? --    No data found.  Updated Vital Signs BP (!) 142/86 (BP Location: Right Arm)   Pulse 61   Temp 98.6 F (37 C) (Oral)   Resp 17   Ht 5\' 10"  (1.778 m)   Wt 185 lb (83.9 kg)   SpO2 99%   BMI 26.54 kg/m   Visual Acuity Right Eye Distance:   Left Eye Distance:   Bilateral Distance:    Right Eye Near:   Left Eye Near:    Bilateral Near:     Physical Exam Vitals and nursing note reviewed.  Constitutional:      General: He is not in acute distress.    Appearance: Normal appearance. He is normal weight. He is not ill-appearing.  HENT:     Head: Normocephalic and  atraumatic.  Skin:    General: Skin is warm and dry.     Capillary Refill: Capillary refill takes less than 2 seconds.     Findings: Rash present.  Neurological:     General: No focal deficit present.     Mental Status: He is alert and oriented to person, place, and time.  Psychiatric:        Mood and Affect: Mood normal.        Behavior: Behavior normal.        Thought Content: Thought content normal.        Judgment: Judgment normal.      UC Treatments / Results  Labs (all labs ordered are listed, but only abnormal results are displayed) Labs Reviewed - No data to display  EKG   Radiology No results found.  Procedures Procedures (including critical care time)  Medications Ordered in UC Medications - No data to display  Initial Impression / Assessment and Plan / UC Course  I have reviewed the triage vital signs and the nursing notes.  Pertinent labs & imaging results that were available during my care of the patient were reviewed by me and considered in my medical decision making (see chart for details).   Patient is a very pleasant, nontoxic-appearing 65 year old male here for evaluation of rash on his left thigh.  Patient reports that the rash is developed at the site where he removed a tick 10 to 11 days ago from his left thigh.  He is describes it as a small brown tick.  He has not had any further rashes, joint pain above his normal, headache, or fever.  Patient does have a bull's-eye rash at the site of the tick bite which is classic for erythema migrans.  We will treat patient with doxycycline twice daily for 28 days.  Return precautions reviewed with patient.   Final Clinical Impressions(s) / UC Diagnoses   Final diagnoses:  Erythema migrans (Lyme disease)  Tick bite of left thigh, initial encounter     Discharge Instructions     Take the doxycycline twice daily for 28 days.  Take it with food.  The doxycycline will make you more prone to sunburns and  make sure that you are wearing sunscreen when you are outdoors and you reapply it every  2 hours.  I do recommend that you take an over-the-counter probiotic 1 hour after each dose of your antibiotic to prevent diarrhea from occurring.  If you have any new rashes develop, joint pain, headache, or fever return for reevaluation or see your primary care provider.    ED Prescriptions    Medication Sig Dispense Auth. Provider   doxycycline (VIBRAMYCIN) 100 MG capsule Take 1 capsule (100 mg total) by mouth 2 (two) times daily for 28 days. 56 capsule Margarette Canada, NP     PDMP not reviewed this encounter.   Margarette Canada, NP 05/09/21 1247

## 2021-05-09 NOTE — Discharge Instructions (Addendum)
Take the doxycycline twice daily for 28 days.  Take it with food.  The doxycycline will make you more prone to sunburns and make sure that you are wearing sunscreen when you are outdoors and you reapply it every 2 hours.  I do recommend that you take an over-the-counter probiotic 1 hour after each dose of your antibiotic to prevent diarrhea from occurring.  If you have any new rashes develop, joint pain, headache, or fever return for reevaluation or see your primary care provider.

## 2021-05-09 NOTE — ED Triage Notes (Signed)
Patient is here for a tick bite that occurred on 05/10-5/11. States that this is on his upper left thigh. Patient states that area is tender but does now have a bullseye rash around it.

## 2021-05-11 ENCOUNTER — Telehealth: Payer: Self-pay

## 2021-05-11 ENCOUNTER — Telehealth: Payer: Self-pay | Admitting: *Deleted

## 2021-05-11 NOTE — Telephone Encounter (Signed)
Shouldn't be a problem.

## 2021-05-11 NOTE — Telephone Encounter (Signed)
"  Dr. Milinda Pointer has prescribed me an antibiotic for my toe.  Saturday I went to walk-in clinic.  I diagnosed with Brown's Disease.  They gave me Doxycycline.  I want to know if I should keep taking the two of them.  I have to take it for 28 days.  Give me a call back."

## 2021-05-11 NOTE — Telephone Encounter (Signed)
Noted, he was started on doxycycline.

## 2021-05-11 NOTE — Telephone Encounter (Signed)
Per chart review pt was seen at [facility] in [place] on 05/09/21.

## 2021-05-11 NOTE — Telephone Encounter (Signed)
[place] Night - Client TELEPHONE ADVICE RECORD [facility] Patient Name: William Richardson Gender: Male DOB: 03-10-1956 Age: 65 Y 4 M 25 D Return Phone Number: [phone] (Primary), [phone] (Secondary) Address: City/ State/ Zip: [place] [address] Client [facility] Night - Client Client Site [place] - Night Contact Type Call Who Is Calling Patient / Member / Family / Caregiver Call Type Triage / Clinical Relationship To Patient Self Return Phone Number [phone] (Primary) Chief Complaint Dizziness Reason for Call Symptomatic / Request for [place] states that he had a tick on 04-29-21 which he removed, and he now has a bulls eye around it. Translation No Nurse Assessment Nurse: Percell [place], RN, Sherlie Ban Date/Time (Eastern Time): 05/09/2021 10:44:38 AM Confirm and document reason for call. If symptomatic, describe symptoms. ---Caller states that he had a tick on 04-29-21 which he removed, and he now has a bulls eye around it. Bite is on left hip. Denies fever. States is having some dizziness today. Does the patient have any new or worsening symptoms? ---Yes Will a triage be completed? ---Yes Related visit to physician within the last 2 weeks? ---No Does the PT have any chronic conditions? (i.e. diabetes, asthma, this includes High risk factors for pregnancy, etc.) ---No Is this a behavioral health or substance abuse call? ---No Guidelines Guideline Title Affirmed Question Affirmed Notes Nurse Date/Time (Eastern Time) Tick Bite Red ring or bull's-eye rash occurs at tick bite Percell [place], RN, Sherlie Ban 05/09/2021 10:45:45 AM Disp. Time Eilene Ghazi Time) Disposition Final User 05/09/2021 10:48:57 AM See PCP within 24 Hours Yes Percell [place], RN, Melina Schools Disagree/Comply Comply PLEASE NOTE: All timestamps contained within this report are represented as [place] Standard  Time. CONFIDENTIALTY NOTICE: This fax transmission is intended only for the addressee. It contains information that is legally privileged, confidential or otherwise protected from use or disclosure. If you are not the intended recipient, you are strictly prohibited from reviewing, disclosing, copying using or disseminating any of this information or taking any action in reliance on or regarding this information. If you have received this fax in error, please notify us immediately by telephone so that we can arrange for its return to [place]. Phone: [phone], Toll-Free: [phone], Fax: [phone] Page: 2 of 2 Call Id: [id number] [place] Understands Yes PreDisposition Did not know what to do Care Advice Given Per Guideline SEE PCP WITHIN 24 HOURS: * IF OFFICE WILL BE OPEN: You need to be examined within the next 24 hours. Call your doctor (or NP/PA) when the office opens and make an appointment. * IF OFFICE WILL BE CLOSED: You need to be seen within the next 24 hours. A clinic or an urgent care center is often a good source of care if your doctor's office is closed or you can't get an appointment. * Fever occurs CALL BACK IF: * You become worse CARE ADVICE given per Tick Bites (Adult) guideline. ANTIBIOTIC OINTMENT: * Apply antibiotic ointment (OTC) to the infected area 3 times per day. Referrals [facility] [place]

## 2021-05-15 ENCOUNTER — Encounter: Payer: Self-pay | Admitting: Family Medicine

## 2021-05-19 ENCOUNTER — Ambulatory Visit: Payer: Medicare HMO | Admitting: Family Medicine

## 2021-06-02 DIAGNOSIS — G4733 Obstructive sleep apnea (adult) (pediatric): Secondary | ICD-10-CM | POA: Diagnosis not present

## 2021-06-02 DIAGNOSIS — G4730 Sleep apnea, unspecified: Secondary | ICD-10-CM | POA: Diagnosis not present

## 2021-06-07 ENCOUNTER — Encounter: Payer: Self-pay | Admitting: Family Medicine

## 2021-06-15 ENCOUNTER — Other Ambulatory Visit: Payer: Self-pay

## 2021-06-15 ENCOUNTER — Encounter: Payer: Self-pay | Admitting: Family Medicine

## 2021-06-15 ENCOUNTER — Ambulatory Visit (INDEPENDENT_AMBULATORY_CARE_PROVIDER_SITE_OTHER): Payer: Medicare HMO | Admitting: Family Medicine

## 2021-06-15 ENCOUNTER — Telehealth: Payer: Self-pay | Admitting: Family Medicine

## 2021-06-15 VITALS — BP 112/64 | HR 100 | Temp 98.3°F | Ht 70.0 in | Wt 185.0 lb

## 2021-06-15 DIAGNOSIS — R197 Diarrhea, unspecified: Secondary | ICD-10-CM

## 2021-06-15 DIAGNOSIS — R1030 Lower abdominal pain, unspecified: Secondary | ICD-10-CM | POA: Diagnosis not present

## 2021-06-15 DIAGNOSIS — A0472 Enterocolitis due to Clostridium difficile, not specified as recurrent: Secondary | ICD-10-CM | POA: Insufficient documentation

## 2021-06-15 HISTORY — DX: Enterocolitis due to Clostridium difficile, not specified as recurrent: A04.72

## 2021-06-15 LAB — POC URINALSYSI DIPSTICK (AUTOMATED)
Blood, UA: NEGATIVE
Glucose, UA: NEGATIVE
Leukocytes, UA: NEGATIVE
Nitrite, UA: NEGATIVE
Protein, UA: POSITIVE — AB
Spec Grav, UA: 1.025 (ref 1.010–1.025)
Urobilinogen, UA: 1 E.U./dL
pH, UA: 6 (ref 5.0–8.0)

## 2021-06-15 NOTE — Telephone Encounter (Signed)
Ok to see in office with neg COVID test.  Can we place today at 3:45pm? Thanks.

## 2021-06-15 NOTE — Assessment & Plan Note (Addendum)
Describes 4+ episodes of loose stools daily associated with abdominal discomfort, without blood in stool. Diarrhea started after prolonged antibiotic course.  Anticipate antibiotic related GI distress. rec start probiotic course.  Check labwork and C diff stool.  In recent tick bite, check for lyme disease (although recently completed 1 month doxy).

## 2021-06-15 NOTE — Patient Instructions (Addendum)
Good to meet you today  I think you may have residual GI symptoms after recent prolonged antibiotic course.  Push fluids (water, ginger ale, gatorade) to ensure staying well hydrated.  Start probiotic like align or phillips colon health to help replenish healthy gut flora.  Labs and stool test today to help rule out other infection.  Urinalysis today. We will be in touch with results.  Let us know if not improving with this.

## 2021-06-15 NOTE — Progress Notes (Signed)
Patient ID: William Numbers., male    DOB: 10/13/1956, 65 y.o.   MRN: [id number]  This visit was conducted in person.  BP 112/64   Pulse 100   Temp 98.3 F (36.8 C) (Temporal)   Ht 5\' 10"  (1.778 m)   Wt 185 lb (83.9 kg)   SpO2 95%   BMI 26.54 kg/m   Orthostatic VS for the past 24 hrs (Last 3 readings):  BP- Lying BP- Standing at 3 minutes  06/15/21 1624 -- 108/70  06/15/21 1621 110/66 --     CC: abd pain, diarrhea, dizziness Subjective:   HPI: William Richardson. is a 65 y.o. male presenting on 06/15/2021 for Abdominal Pain (C/o abd tenderness, cramping and diarrhea.  Sxs started about 05/27/21. ) and Dizziness (C/o occasional dizzy spells.  Started about 2 wks ago. )   Pt of Dr Cody's new to me presents with almost 3 wk h/o abdominal discomfort described as cramping associated with diarrhea (loose stools not watery) and occasional lightheadedness, lower abd pain. Last night felt chills. HA to top of head, managed with tylenol with benefit. Currently having 3-4 stools/day, normally 1 soft formed stool/day.   Tick bite 04/29/2021 concern for erythema migrans seen at [facility] treated with 1 month course of doxycycline to cover possible lyme disease. Finished this about 2 weeks ago. GI symptoms started around the same time.   Denies residual rash, no fever, nausea/vomiting, blood in stool, urine changes, dysuria, hematuria, chest pain, dyspnea, or cough.   No new foods.  Uses well water.  No sick contacts at home.   Last week he started OTC probiotic without significant improvement.   Colonoscopy 10/2017 - rectal HP, rpt 10 yrs Vicente Males).      Relevant past medical, surgical, family and social history reviewed and updated as indicated. Interim medical history since our last visit reviewed. Allergies and medications reviewed and updated. Outpatient Medications Prior to Visit  Medication Sig Dispense Refill   diphenhydrAMINE-APAP, sleep, (TYLENOL PM EXTRA STRENGTH PO)  Take by mouth as needed.     fluticasone (FLONASE) 50 MCG/ACT nasal spray Place 2 sprays into both nostrils daily. (Patient taking differently: Place 2 sprays into both nostrils as needed.) 3 g 12   meloxicam (MOBIC) 15 MG tablet Take 1 tablet (15 mg total) by mouth as needed. 90 tablet 0   valACYclovir (VALTREX) 500 MG tablet Take by mouth as needed.      No facility-administered medications prior to visit.     Per HPI unless specifically indicated in ROS section below Review of Systems  Objective:  BP 112/64   Pulse 100   Temp 98.3 F (36.8 C) (Temporal)   Ht 5\' 10"  (1.778 m)   Wt 185 lb (83.9 kg)   SpO2 95%   BMI 26.54 kg/m   Wt Readings from Last 3 Encounters:  06/15/21 185 lb (83.9 kg)  05/09/21 185 lb (83.9 kg)  04/23/21 183 lb 4 oz (83.1 kg)      Physical Exam Vitals and nursing note reviewed.  Constitutional:      Appearance: Normal appearance. He is not ill-appearing.  Neck:     Thyroid: No thyroid mass or thyromegaly.  Cardiovascular:     Rate and Rhythm: Normal rate and regular rhythm.     Pulses: Normal pulses.     Heart sounds: Normal heart sounds. No murmur heard. Pulmonary:     Effort: Pulmonary effort is normal. No respiratory distress.  Breath sounds: Normal breath sounds. No wheezing, rhonchi or rales.  Abdominal:     General: Bowel sounds are normal. There is no distension.     Palpations: Abdomen is soft. There is no mass.     Tenderness: There is abdominal tenderness (mild) in the right lower quadrant, suprapubic area and left lower quadrant. There is no right CVA tenderness, left CVA tenderness, guarding or rebound. Negative signs include Murphy's sign.     Hernia: No hernia is present.  Musculoskeletal:     Right lower leg: No edema.     Left lower leg: No edema.  Skin:    General: Skin is warm and dry.     Findings: No rash.  Neurological:     Mental Status: He is alert.  Psychiatric:        Mood and Affect: Mood normal.         Behavior: Behavior normal.      Results for orders placed or performed in visit on 06/15/21  POCT Urinalysis Dipstick (Automated)  Result Value Ref Range   Color, UA dark yellow    Clarity, UA clear    Glucose, UA Negative Negative   Bilirubin, UA 1+    Ketones, UA +/-    Spec Grav, UA 1.025 1.010 - 1.025   Blood, UA negative    pH, UA 6.0 5.0 - 8.0   Protein, UA Positive (A) Negative   Urobilinogen, UA 1.0 0.2 or 1.0 E.U./dL   Nitrite, UA negative    Leukocytes, UA Negative Negative   Assessment & Plan:  This visit occurred during the SARS-CoV-2 public health emergency.  Safety protocols were in place, including screening questions prior to the visit, additional usage of staff PPE, and extensive cleaning of exam room while observing appropriate contact time as indicated for disinfecting solutions.   Problem List Items Addressed This Visit     Lower abdominal pain - Primary    Symptoms started around the time of 1 month doxy course - ?GI symptoms from prolonged antibiotic. rec continue probiotic (try align or phillips colon health brands), check labs (CBC, CMP, lipase) and stool for C diff. Update if worsening or persistent symptoms for further evaluation.        Relevant Orders   Comprehensive metabolic panel   TSH   CBC with Differential/Platelet   Lipase   C. difficile GDH and Toxin A/B   B. burgdorfi antibodies by WB   POCT Urinalysis Dipstick (Automated) (Completed)   Diarrhea    Describes 4+ episodes of loose stools daily associated with abdominal discomfort, without blood in stool. Diarrhea started after prolonged antibiotic course.  Anticipate antibiotic related GI distress. rec start probiotic course.  Check labwork and C diff stool.  In recent tick bite, check for lyme disease (although recently completed 1 month doxy).        Relevant Orders   Comprehensive metabolic panel   TSH   CBC with Differential/Platelet   Lipase   C. difficile GDH and Toxin A/B    B. burgdorfi antibodies by WB     No orders of the defined types were placed in this encounter.  Orders Placed This Encounter  Procedures   Comprehensive metabolic panel   TSH   CBC with Differential/Platelet   Lipase   C. difficile GDH and Toxin A/B    Standing Status:   Future    Standing Expiration Date:   08/15/2021   B. burgdorfi antibodies by [facility]   POCT Urinalysis  Dipstick (Automated)    Patient Instructions  Good to meet you today  I think you may have residual GI symptoms after recent prolonged antibiotic course.  Push fluids (water, ginger ale, gatorade) to ensure staying well hydrated.  Start probiotic like align or phillips colon health to help replenish healthy gut flora.  Labs and stool test today to help rule out other infection.  Urinalysis today. We will be in touch with results.  Let us know if not improving with this.   Follow up plan: Return if symptoms worsen or fail to improve.  Ria Bush, MD

## 2021-06-15 NOTE — Telephone Encounter (Signed)
Patient wife Florentina Jenny calling requesting an appt with Dr Einar Pheasant.  Zettie Pho is aware that Dr Einar Pheasant is out this week so any urgent needs will have to be handled by another Provider.  They are okay with seeing another Provider, first available.   Patient c/o abdominal tenderness, cramping and diarrhea x several weeks. Patient denies fever, nausea or vomiting.  No Covid exposure, no recent covid test. Vikki states that he is taking an At-Home test today and will call back and let us know the results.   Please advise if able to see this patient, thanks.

## 2021-06-15 NOTE — Telephone Encounter (Signed)
Pt has been scheduled. He will pull around to the back of the building and call the  bat phone when he arrives.

## 2021-06-15 NOTE — Assessment & Plan Note (Addendum)
Symptoms started around the time of 1 month doxy course - ?GI symptoms from prolonged antibiotic. rec continue probiotic (try align or phillips colon health brands), check labs (CBC, CMP, lipase) and stool for C diff. Update if worsening or persistent symptoms for further evaluation.

## 2021-06-15 NOTE — Telephone Encounter (Signed)
Noted  

## 2021-06-15 NOTE — Telephone Encounter (Signed)
Pt's wife called back stating the home test came back negative.

## 2021-06-16 ENCOUNTER — Other Ambulatory Visit (INDEPENDENT_AMBULATORY_CARE_PROVIDER_SITE_OTHER): Payer: Medicare HMO

## 2021-06-16 DIAGNOSIS — R197 Diarrhea, unspecified: Secondary | ICD-10-CM

## 2021-06-16 DIAGNOSIS — R1030 Lower abdominal pain, unspecified: Secondary | ICD-10-CM

## 2021-06-16 LAB — COMPREHENSIVE METABOLIC PANEL
ALT: 14 U/L (ref 0–53)
AST: 15 U/L (ref 0–37)
Albumin: 3.8 g/dL (ref 3.5–5.2)
Alkaline Phosphatase: 59 U/L (ref 39–117)
BUN: 11 mg/dL (ref 6–23)
CO2: 25 mEq/L (ref 19–32)
Calcium: 8.6 mg/dL (ref 8.4–10.5)
Chloride: 101 mEq/L (ref 96–112)
Creatinine, Ser: 1.28 mg/dL (ref 0.40–1.50)
GFR: 58.32 mL/min — ABNORMAL LOW (ref >60.00)
Glucose, Bld: 98 mg/dL (ref 70–99)
Potassium: 3.9 mEq/L (ref 3.5–5.1)
Sodium: 135 mEq/L (ref 135–145)
Total Bilirubin: 1 mg/dL (ref 0.2–1.2)
Total Protein: 6.5 g/dL (ref 6.0–8.3)

## 2021-06-16 LAB — CBC WITH DIFFERENTIAL/PLATELET
Basophils Absolute: 0 10*3/uL (ref 0.0–0.1)
Basophils Relative: 0.3 % (ref 0.0–3.0)
Eosinophils Absolute: 0.1 10*3/uL (ref 0.0–0.7)
Eosinophils Relative: 0.4 % (ref 0.0–5.0)
HCT: 50.1 % (ref 39.0–52.0)
Hemoglobin: 17.2 g/dL — ABNORMAL HIGH (ref 13.0–17.0)
Lymphocytes Relative: 6.3 % — ABNORMAL LOW (ref 12.0–46.0)
Lymphs Abs: 1 10*3/uL (ref 0.7–4.0)
MCHC: 34.3 g/dL (ref 30.0–36.0)
MCV: 88.4 fl (ref 78.0–100.0)
Monocytes Absolute: 2.5 10*3/uL — ABNORMAL HIGH (ref 0.1–1.0)
Monocytes Relative: 15.5 % — ABNORMAL HIGH (ref 3.0–12.0)
Neutro Abs: 12.3 10*3/uL — ABNORMAL HIGH (ref 1.4–7.7)
Neutrophils Relative %: 77.5 % — ABNORMAL HIGH (ref 43.0–77.0)
Platelets: 230 10*3/uL (ref 150.0–400.0)
RBC: 5.66 Mil/uL (ref 4.22–5.81)
RDW: 13.1 % (ref 11.5–15.5)
WBC: 15.9 10*3/uL — ABNORMAL HIGH (ref 4.0–10.5)

## 2021-06-16 LAB — TSH: TSH: 1.11 u[IU]/mL (ref 0.35–4.50)

## 2021-06-16 LAB — LIPASE: Lipase: 4 U/L — ABNORMAL LOW (ref 11.0–59.0)

## 2021-06-17 ENCOUNTER — Encounter: Payer: Self-pay | Admitting: Family Medicine

## 2021-06-17 ENCOUNTER — Other Ambulatory Visit: Payer: Self-pay | Admitting: Family Medicine

## 2021-06-17 DIAGNOSIS — A0472 Enterocolitis due to Clostridium difficile, not specified as recurrent: Secondary | ICD-10-CM

## 2021-06-17 LAB — C. DIFFICILE GDH AND TOXIN A/B
GDH ANTIGEN: DETECTED
MICRO NUMBER:: 12059523
SPECIMEN QUALITY:: ADEQUATE
TOXIN A AND B: DETECTED

## 2021-06-17 LAB — B. BURGDORFI ANTIBODIES BY WB
B burgdorferi IgG Abs (IB): NEGATIVE
B burgdorferi IgM Abs (IB): NEGATIVE
Lyme Disease 18 kD IgG: NONREACTIVE
Lyme Disease 23 kD IgG: REACTIVE — AB
Lyme Disease 23 kD IgM: NONREACTIVE
Lyme Disease 28 kD IgG: NONREACTIVE
Lyme Disease 30 kD IgG: NONREACTIVE
Lyme Disease 39 kD IgG: NONREACTIVE
Lyme Disease 39 kD IgM: NONREACTIVE
Lyme Disease 41 kD IgG: NONREACTIVE
Lyme Disease 41 kD IgM: NONREACTIVE
Lyme Disease 45 kD IgG: NONREACTIVE
Lyme Disease 58 kD IgG: NONREACTIVE
Lyme Disease 66 kD IgG: NONREACTIVE
Lyme Disease 93 kD IgG: NONREACTIVE

## 2021-06-17 MED ORDER — FIDAXOMICIN 200 MG PO TABS: 200.0000 mg | ORAL_TABLET | Freq: Two times a day (BID) | ORAL | 0 refills | Status: DC

## 2021-06-17 NOTE — Telephone Encounter (Signed)
Spoke with pt answering all questions to his satisfaction.  Pt expresses his thanks.

## 2021-06-18 MED ORDER — VANCOMYCIN HCL 125 MG PO CAPS: 125.0000 mg | ORAL_CAPSULE | Freq: Four times a day (QID) | ORAL | 0 refills | Status: DC

## 2021-06-18 NOTE — Telephone Encounter (Signed)
Fidaximicin unaffordable. I have sent in vancomycin oral to take.  Plz let us know if any trouble filling this.

## 2021-06-18 NOTE — Telephone Encounter (Signed)
[facility] - Client Nonclinical Telephone Record AccessNurse Client [name] - Client Client Site [place] Physician Ria Bush - MD Contact Type Call Who Is Calling Patient / Member / Family / Caregiver Caller Name Leandro Berkowitz Caller Phone Number [phone] Patient Name William Richardson Patient DOB 12/18/1956 Call Type Message Only Information Provided Reason for Call Request for General Office Information Initial Comment Caller is asking to speak with Dr Danise Mina' nurse and states prescription is $1500. Declined triage. Disp. Time Disposition Final User 06/17/2021 5:23:48 PM General Information Provided Yes Renato Shin Call Closed By: Renato Shin Transaction Date/Time: 06/17/2021 5:21:30 PM (ET)

## 2021-07-10 DIAGNOSIS — G4733 Obstructive sleep apnea (adult) (pediatric): Secondary | ICD-10-CM | POA: Diagnosis not present

## 2021-07-14 DIAGNOSIS — G4730 Sleep apnea, unspecified: Secondary | ICD-10-CM | POA: Diagnosis not present

## 2021-07-14 DIAGNOSIS — G4733 Obstructive sleep apnea (adult) (pediatric): Secondary | ICD-10-CM | POA: Diagnosis not present

## 2021-08-04 ENCOUNTER — Other Ambulatory Visit: Payer: Self-pay

## 2021-08-04 ENCOUNTER — Other Ambulatory Visit
Admission: RE | Admit: 2021-08-04 | Discharge: 2021-08-04 | Disposition: A | Discharge status: Home / Self Care | Payer: Medicare HMO | Source: Ambulatory Visit | Attending: Otolaryngology | Admitting: Otolaryngology

## 2021-08-04 DIAGNOSIS — Z20822 Contact with and (suspected) exposure to covid-19: Secondary | ICD-10-CM | POA: Diagnosis not present

## 2021-08-04 DIAGNOSIS — Z01812 Encounter for preprocedural laboratory examination: Secondary | ICD-10-CM | POA: Insufficient documentation

## 2021-08-04 LAB — SARS CORONAVIRUS 2 (TAT 6-24 HRS): SARS Coronavirus 2: NEGATIVE

## 2021-08-06 ENCOUNTER — Ambulatory Visit: Payer: Medicare HMO | Attending: Otolaryngology

## 2021-08-06 DIAGNOSIS — G4733 Obstructive sleep apnea (adult) (pediatric): Secondary | ICD-10-CM | POA: Diagnosis not present

## 2021-08-07 ENCOUNTER — Other Ambulatory Visit: Payer: Self-pay

## 2021-08-08 ENCOUNTER — Other Ambulatory Visit: Payer: Self-pay | Admitting: Family Medicine

## 2021-08-10 ENCOUNTER — Other Ambulatory Visit: Payer: Self-pay | Admitting: Family Medicine

## 2021-08-10 DIAGNOSIS — Z23 Encounter for immunization: Secondary | ICD-10-CM | POA: Diagnosis not present

## 2021-08-20 DIAGNOSIS — D485 Neoplasm of uncertain behavior of skin: Secondary | ICD-10-CM | POA: Diagnosis not present

## 2021-08-20 DIAGNOSIS — D225 Melanocytic nevi of trunk: Secondary | ICD-10-CM | POA: Diagnosis not present

## 2021-08-20 DIAGNOSIS — R208 Other disturbances of skin sensation: Secondary | ICD-10-CM | POA: Diagnosis not present

## 2021-08-20 DIAGNOSIS — D2262 Melanocytic nevi of left upper limb, including shoulder: Secondary | ICD-10-CM | POA: Diagnosis not present

## 2021-08-20 DIAGNOSIS — L570 Actinic keratosis: Secondary | ICD-10-CM | POA: Diagnosis not present

## 2021-08-20 DIAGNOSIS — Z85828 Personal history of other malignant neoplasm of skin: Secondary | ICD-10-CM | POA: Diagnosis not present

## 2021-08-20 DIAGNOSIS — X32XXXA Exposure to sunlight, initial encounter: Secondary | ICD-10-CM | POA: Diagnosis not present

## 2021-08-20 DIAGNOSIS — D2111 Benign neoplasm of connective and other soft tissue of right upper limb, including shoulder: Secondary | ICD-10-CM | POA: Diagnosis not present

## 2021-08-24 DIAGNOSIS — Z6830 Body mass index (BMI) 30.0-30.9, adult: Secondary | ICD-10-CM | POA: Diagnosis not present

## 2021-08-24 DIAGNOSIS — E669 Obesity, unspecified: Secondary | ICD-10-CM | POA: Diagnosis not present

## 2021-08-24 DIAGNOSIS — Z791 Long term (current) use of non-steroidal anti-inflammatories (NSAID): Secondary | ICD-10-CM | POA: Diagnosis not present

## 2021-08-24 DIAGNOSIS — Z833 Family history of diabetes mellitus: Secondary | ICD-10-CM | POA: Diagnosis not present

## 2021-10-07 DIAGNOSIS — G4733 Obstructive sleep apnea (adult) (pediatric): Secondary | ICD-10-CM | POA: Diagnosis not present

## 2021-10-07 DIAGNOSIS — G4730 Sleep apnea, unspecified: Secondary | ICD-10-CM | POA: Diagnosis not present

## 2021-11-07 DIAGNOSIS — G4730 Sleep apnea, unspecified: Secondary | ICD-10-CM | POA: Diagnosis not present

## 2021-11-07 DIAGNOSIS — G4733 Obstructive sleep apnea (adult) (pediatric): Secondary | ICD-10-CM | POA: Diagnosis not present

## 2021-11-26 DIAGNOSIS — Z0100 Encounter for examination of eyes and vision without abnormal findings: Secondary | ICD-10-CM | POA: Diagnosis not present

## 2021-11-26 DIAGNOSIS — H524 Presbyopia: Secondary | ICD-10-CM | POA: Diagnosis not present

## 2021-12-09 ENCOUNTER — Encounter: Payer: Self-pay | Admitting: Family Medicine

## 2021-12-09 ENCOUNTER — Other Ambulatory Visit: Payer: Self-pay

## 2021-12-09 ENCOUNTER — Ambulatory Visit (INDEPENDENT_AMBULATORY_CARE_PROVIDER_SITE_OTHER): Payer: Medicare HMO | Admitting: Family Medicine

## 2021-12-09 VITALS — BP 118/70 | HR 60 | Temp 97.6°F | Ht 70.0 in | Wt 194.2 lb

## 2021-12-09 DIAGNOSIS — R351 Nocturia: Secondary | ICD-10-CM | POA: Diagnosis not present

## 2021-12-09 DIAGNOSIS — B001 Herpesviral vesicular dermatitis: Secondary | ICD-10-CM | POA: Diagnosis not present

## 2021-12-09 DIAGNOSIS — Z23 Encounter for immunization: Secondary | ICD-10-CM | POA: Diagnosis not present

## 2021-12-09 DIAGNOSIS — G4700 Insomnia, unspecified: Secondary | ICD-10-CM | POA: Diagnosis not present

## 2021-12-09 DIAGNOSIS — N401 Enlarged prostate with lower urinary tract symptoms: Secondary | ICD-10-CM | POA: Insufficient documentation

## 2021-12-09 MED ORDER — DIPHENHYDRAMINE HCL 25 MG PO TABS: 12.5000 mg | ORAL_TABLET | Freq: Every evening | ORAL | Status: DC | PRN

## 2021-12-09 MED ORDER — MULTIVITAMIN ADULT PO TABS: 1.0000 | ORAL_TABLET | Freq: Every day | ORAL | Status: DC

## 2021-12-09 MED ORDER — DIPHENHYDRAMINE HCL 25 MG PO TABS
25.0000 mg | ORAL_TABLET | Freq: Every evening | ORAL | Status: DC | PRN
Start: 2021-12-09 — End: 2021-12-09

## 2021-12-09 NOTE — Assessment & Plan Note (Signed)
Continue PRN valtrex.

## 2021-12-09 NOTE — Patient Instructions (Addendum)
Pneumovax today.  Try to cut down on benadryl for sleep - try 1/2 tab daily.   Sleep hygiene checklist: 1. Avoid naps during the day 2. Avoid stimulants such as caffeine and nicotine. Avoid bedtime alcohol (it can speed onset of sleep but the body's metabolism can cause awakenings). 3. All forms of exercise help ensure sound sleep - limit vigorous exercise to morning or late afternoon 4. Avoid food too close to bedtime including chocolate (which contains caffeine) 5. Soak up natural light 6. Establish regular bedtime routine. 7. Associate bed with sleep - avoid TV, computer or phone, reading while in bed. 8. Ensure pleasant, relaxing sleep environment - quiet, dark, cool room.

## 2021-12-09 NOTE — Assessment & Plan Note (Addendum)
Anticipate BPH related.  Will check DRE/PSA at upcoming physical and consider medication.  I-PSS = 10-2

## 2021-12-09 NOTE — Assessment & Plan Note (Signed)
Chronic insomnia managing with benadryl 25mg  nightly. Discussed possible anticholinergic effect of med. Given concern mentioned over memory, recommend try to taper down on benadryl dose. Sleep hygiene checklist provided.

## 2021-12-09 NOTE — Progress Notes (Signed)
Patient ID: William Richardson., male    DOB: 09/14/1956, 65 y.o.   MRN: [id number]  This visit was conducted in person.  BP 118/70    Pulse 60    Temp 97.6 F (36.4 C) (Temporal)    Ht 5\' 10"  (1.778 m)    Wt 194 lb 4 oz (88.1 kg)    SpO2 98%    BMI 27.87 kg/m    CC: TOC from Dr Einar Pheasant Subjective:   HPI: William Richardson. is a 65 y.o. male presenting on 12/09/2021 for Transitions Of Care (TOC from Dr. Einar Pheasant.  Wants to discuss pneumonia shot. )   C diff infection after prolonged doxycycline course to treat presumed tick borne illness. Vancomycin course resolved symptoms.  Last AMW/CPE was 02/2021.  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   Takes flonase PRN, takes meloxicam 1 tab every few weeks with benefit.   Valtrex 2000mg  x1 PRN fever blisters.   Noes some difficulty with memory, no trouble with names.   Notes nocturia x2-3, wants prostate checked at upcoming physical.   Longstanding difficulty falling asleep - he's been using benadryl 25mg  nightly with benefit.      Relevant past medical, surgical, family and social history reviewed and updated as indicated. Interim medical history since our last visit reviewed. Allergies and medications reviewed and updated. Outpatient Medications Prior to Visit  Medication Sig Dispense Refill   fluticasone (FLONASE) 50 MCG/ACT nasal spray Place 2 sprays into both nostrils daily. (Patient taking differently: Place 2 sprays into both nostrils as needed.) 3 g 12   meloxicam (MOBIC) 15 MG tablet TAKE 1 TABLET BY MOUTH AS NEEDED 60 tablet 0   valACYclovir (VALTREX) 500 MG tablet Take by mouth as needed.      valACYclovir (VALTREX) 500 MG tablet Take 4 tablets (2,000 mg total) by mouth 2 (two) times daily as needed (x1 day for fever blister).     diphenhydrAMINE-APAP, sleep, (TYLENOL PM EXTRA STRENGTH PO) Take by mouth as needed.     vancomycin (VANCOCIN) 125 MG capsule Take 1 capsule (125 mg total) by mouth 4 (four)  times daily. 40 capsule 0   No facility-administered medications prior to visit.     Per HPI unless specifically indicated in ROS section below Review of Systems  Objective:  BP 118/70    Pulse 60    Temp 97.6 F (36.4 C) (Temporal)    Ht 5\' 10"  (1.778 m)    Wt 194 lb 4 oz (88.1 kg)    SpO2 98%    BMI 27.87 kg/m   Wt Readings from Last 3 Encounters:  12/09/21 194 lb 4 oz (88.1 kg)  06/15/21 185 lb (83.9 kg)  05/09/21 185 lb (83.9 kg)      Physical Exam Vitals and nursing note reviewed.  Constitutional:      Appearance: Normal appearance. He is not ill-appearing.  Cardiovascular:     Rate and Rhythm: Normal rate and regular rhythm.     Pulses: Normal pulses.     Heart sounds: Normal heart sounds. No murmur heard. Pulmonary:     Effort: Pulmonary effort is normal. No respiratory distress.     Breath sounds: Normal breath sounds. No wheezing, rhonchi or rales.  Musculoskeletal:     Right lower leg: No edema.     Left lower leg: No edema.  Skin:    General: Skin is warm and dry.     Findings: No rash.  Neurological:     Mental Status: He is alert.  Psychiatric:        Mood and Affect: Mood normal.        Behavior: Behavior normal.       Assessment & Plan:  This visit occurred during the SARS-CoV-2 public health emergency.  Safety protocols were in place, including screening questions prior to the visit, additional usage of staff PPE, and extensive cleaning of exam room while observing appropriate contact time as indicated for disinfecting solutions.   Problem List Items Addressed This Visit     Herpes labialis    Continue PRN valtrex.       Relevant Medications   valACYclovir (VALTREX) 500 MG tablet   Nocturia    Anticipate BPH related.  Will check DRE/PSA at upcoming physical and consider medication.  I-PSS = 10-2      Insomnia - Primary    Chronic insomnia managing with benadryl 25mg  nightly. Discussed possible anticholinergic effect of med. Given concern  mentioned over memory, recommend try to taper down on benadryl dose. Sleep hygiene checklist provided.       Other Visit Diagnoses     Need for 23-polyvalent pneumococcal polysaccharide vaccine       Relevant Orders   Pneumococcal polysaccharide vaccine 23-valent greater than or equal to 2yo subcutaneous/IM (Completed)        Meds ordered this encounter  Medications   DISCONTD: diphenhydrAMINE (BENADRYL) 25 MG tablet    Sig: Take 1 tablet (25 mg total) by mouth at bedtime as needed for sleep.   Multiple Vitamin (MULTIVITAMIN ADULT) TABS    Sig: Take 1 tablet by mouth daily.   diphenhydrAMINE (BENADRYL) 25 MG tablet    Sig: Take 0.5 tablets (12.5 mg total) by mouth at bedtime as needed for sleep.   Orders Placed This Encounter  Procedures   Pneumococcal polysaccharide vaccine 23-valent greater than or equal to 2yo subcutaneous/IM     Patient Instructions  Pneumovax today.  Try to cut down on benadryl for sleep - try 1/2 tab daily.   Sleep hygiene checklist: 1. Avoid naps during the day 2. Avoid stimulants such as caffeine and nicotine. Avoid bedtime alcohol (it can speed onset of sleep but the body's metabolism can cause awakenings). 3. All forms of exercise help ensure sound sleep - limit vigorous exercise to morning or late afternoon 4. Avoid food too close to bedtime including chocolate (which contains caffeine) 5. Soak up natural light 6. Establish regular bedtime routine. 7. Associate bed with sleep - avoid TV, computer or phone, reading while in bed. 8. Ensure pleasant, relaxing sleep environment - quiet, dark, cool room.   Follow up plan: Return in about 3 months (around 03/09/2022) for annual exam, prior fasting for blood work, medicare wellness visit.  Ria Bush, MD

## 2022-01-14 NOTE — Progress Notes (Signed)
Subjective:   William Richardson. is a 66 y.o. male who presents for Medicare Annual/Subsequent preventive examination.  I connected with Particia Richardson today by telephone and verified that I am speaking with the correct person using two identifiers. Location patient: home Location provider: work Persons participating in the virtual visit: patient, Marine scientist.    I discussed the limitations, risks, security and privacy concerns of performing an evaluation and management service by telephone and the availability of in person appointments. I also discussed with the patient that there may be a patient responsible charge related to this service. The patient expressed understanding and verbally consented to this telephonic visit.    Interactive audio and video telecommunications were attempted between this provider and patient, however failed, due to patient having technical difficulties OR patient did not have access to video capability.  We continued and completed visit with audio only.  Some vital signs may be absent or patient reported.   Time Spent with patient on telephone encounter: 20 minutes  Review of Systems     Cardiac Risk Factors include: advanced age (>60men, >28 women);dyslipidemia     Objective:    Today's Vitals   01/15/22 1028  Weight: 187 lb (84.8 kg)  Height: 5\' 10"  (1.778 m)   Body mass index is 26.83 kg/m.  Advanced Directives 01/15/2022 05/09/2021 01/14/2021 11/18/2017  Does Patient Have a Medical Advance Directive? Yes No Yes Yes  Type of Paramedic of [place];Living will - [place];Living will Living will  Does patient want to make changes to medical advance directive? Yes (MAU/Ambulatory/Procedural Areas - Information given) - - -  Copy of [place] in Chart? - - No - copy requested No - copy requested    Current Medications (verified) Outpatient Encounter Medications as of 01/15/2022   Medication Sig   diphenhydrAMINE (BENADRYL) 25 MG tablet Take 0.5 tablets (12.5 mg total) by mouth at bedtime as needed for sleep.   fluticasone (FLONASE) 50 MCG/ACT nasal spray Place 2 sprays into both nostrils daily. (Patient taking differently: Place 2 sprays into both nostrils as needed.)   meloxicam (MOBIC) 15 MG tablet TAKE 1 TABLET BY MOUTH AS NEEDED   Multiple Vitamin (MULTIVITAMIN ADULT) TABS Take 1 tablet by mouth daily.   valACYclovir (VALTREX) 500 MG tablet Take 4 tablets (2,000 mg total) by mouth 2 (two) times daily as needed (x1 day for fever blister).   No facility-administered encounter medications on file as of 01/15/2022.    Allergies (verified) Patient has no known allergies.   History: Past Medical History:  Diagnosis Date   C. difficile diarrhea 06/15/2021   Started after 1 month doxy course. Rx Dificid 10d course 05/2021   Hypogonadism in male    Seasonal allergies    Sleep apnea    Past Surgical History:  Procedure Laterality Date   COLONOSCOPY WITH PROPOFOL N/A 11/18/2017   Procedure: COLONOSCOPY WITH PROPOFOL;  Surgeon: Jonathon Bellows, MD;  Location: [facility] ENDOSCOPY;  Service: Gastroenterology;  Laterality: N/A;   EYE SURGERY Bilateral    cataract   ROTATOR CUFF REPAIR     SPINE SURGERY     Family History  Problem Relation Age of Onset   Dementia Mother    Breast cancer Mother    Emphysema Father    Diabetes Brother    Social History   Socioeconomic History   Marital status: Married    Spouse name: William Richardson   Number of children: 1  Years of education: high school   Highest education level: Not on file  Occupational History   Not on file  Tobacco Use   Smoking status: Never   Smokeless tobacco: Never  Vaping Use   Vaping Use: Never used  Substance and Sexual Activity   Alcohol use: No   Drug use: No   Sexual activity: Yes    Birth control/protection: Post-menopausal  Other Topics Concern   Not on file  Social History Narrative   03/10/20    From: the area   Living: with wife William Richardson (1981)   Work: former Administrator      Family: Health visitor (Daughter), 3 grandchildren      Enjoys: vacation, travel      Exercise: not currently, needs to get back into the gym - has a Higher education careers adviser at [employer]   Diet: not great, skips breakfast, eats when hungry       Safety   Seat belts: Yes    Guns: Yes  and secure   Safe in relationships: Yes    Social Determinants of Health   Financial Resource Strain: Low Risk    Difficulty of Paying Living Expenses: Not hard at all  Food Insecurity: No Food Insecurity   Worried About Charity fundraiser in the Last Year: Never true   [place] in the Last Year: Never true  Transportation Needs: No Transportation Needs   Lack of Transportation (Medical): No   Lack of Transportation (Non-Medical): No  Physical Activity: Inactive   Days of Exercise per Week: 0 days   Minutes of Exercise per Session: 0 min  Stress: No Stress Concern Present   Feeling of Stress : Not at all  Social Connections: Socially Integrated   Frequency of Communication with Friends and Family: Twice a week   Frequency of Social Gatherings with Friends and Family: Three times a week   Attends Religious Services: More than 4 times per year   Active Member of Clubs or Organizations: Yes   Attends Music therapist: More than 4 times per year   Marital Status: Married    Tobacco Counseling Counseling given: Not Answered   Clinical Intake:  Pre-visit preparation completed: Yes  Pain : No/denies pain     BMI - recorded: 26.83 Nutritional Status: BMI 25 -29 Overweight Nutritional Risks: None Diabetes: No  How often do you need to have someone help you when you read instructions, pamphlets, or other written materials from your doctor or pharmacy?: 1 - Never  Diabetic? No  Interpreter Needed?: No  Information entered by :: Orrin Brigham LPN   Activities of Daily Living In your  present state of health, do you have any difficulty performing the following activities: 01/15/2022  Hearing? N  Vision? N  Difficulty concentrating or making decisions? N  Walking or climbing stairs? N  Dressing or bathing? N  Doing errands, shopping? N  Preparing Food and eating ? N  Using the Toilet? N  In the past six months, have you accidently leaked urine? N  Do you have problems with loss of bowel control? N  Managing your Medications? N  Managing your Finances? N  Housekeeping or managing your Housekeeping? N  Some recent data might be hidden    Patient Care Team: Ria Bush, MD as PCP - General (Family Medicine) Lucilla Lame, MD as Consulting Physician (Gastroenterology)  Indicate any recent Medical Services you may have received from other than [facility] providers in the  past year (date may be approximate).     Assessment:   This is a routine wellness examination for William Richardson.  Hearing/Vision screen Hearing Screening - Comments:: No issues Vision Screening - Comments:: Last exam 11/2021, Dr Gloriann Loan, wears reading glasses  Dietary issues and exercise activities discussed: Current Exercise Habits: The patient does not participate in regular exercise at present   Goals Addressed             This Visit's Progress    Patient Stated       Would like to exercise more       Depression Screen PHQ 2/9 Scores 01/15/2022 01/14/2021 03/10/2020 01/22/2019 10/13/2017 10/07/2016  PHQ - 2 Score 0 0 0 0 0 0  PHQ- 9 Score - 0 - 0 - 0    Fall Risk Fall Risk  01/15/2022 01/14/2021 01/22/2019 10/13/2017  Falls in the past year? 0 0 0 No  Number falls in past yr: 0 0 0 -  Injury with Fall? 0 0 0 -  Risk for fall due to : No Fall Risks No Fall Risks - -  Follow up Falls prevention discussed Falls evaluation completed;Falls prevention discussed - -    FALL RISK PREVENTION PERTAINING TO THE HOME:  Any stairs in or around the home? Yes  If so, are there any without handrails? No   Home free of loose throw rugs in walkways, pet beds, electrical cords, etc? Yes  Adequate lighting in your home to reduce risk of falls? Yes   ASSISTIVE DEVICES UTILIZED TO PREVENT FALLS:  Life alert? No  Use of a cane, walker or w/c? No  Grab bars in the bathroom? No  Shower chair or bench in shower? No  Elevated toilet seat or a handicapped toilet? No   TIMED UP AND GO:  Was the test performed? No .    Cognitive Function: Normal cognitive status assessed by  this Nurse Health Advisor. No abnormalities found.   MMSE - Mini Mental State Exam 01/14/2021  Orientation to time 5  Orientation to Place 5  Registration 3  Attention/ Calculation 5  Recall 3  Language- repeat 1        Immunizations Immunization History  Administered Date(s) Administered   Influenza, High Dose Seasonal PF 11/24/2021   Influenza,inj,Quad PF,6+ Mos 10/01/2015, 10/13/2017, 12/09/2018   Pneumococcal Conjugate-13 03/10/2020   Pneumococcal Polysaccharide-23 12/09/2021   Tdap 10/01/2015   Zoster Recombinat (Shingrix) 08/10/2021   Zoster, Live 01/22/2016    TDAP status: Up to date  Flu Vaccine status: Up to date  Pneumococcal vaccine status: Up to date  Covid-19 vaccine status: Information provided on how to obtain vaccines.   Qualifies for Shingles Vaccine? Yes   Zostavax completed Yes   Shingrix Completed?: Yes  Screening Tests Health Maintenance  Topic Date Due   COVID-19 Vaccine (1) Never done   Zoster Vaccines- Shingrix (2 of 2) 10/05/2021   TETANUS/TDAP  09/30/2025   COLONOSCOPY (Pts 45-15yrs Insurance coverage will need to be confirmed)  11/19/2027   Pneumonia Vaccine 59+ Years old  Completed   INFLUENZA VACCINE  Completed   Hepatitis C Screening  Completed   HPV VACCINES  Aged Out    Health Maintenance  Health Maintenance Due  Topic Date Due   COVID-19 Vaccine (1) Never done   Zoster Vaccines- Shingrix (2 of 2) 10/05/2021    Colorectal cancer screening: Type of  screening: Colonoscopy. Completed 11/18/17. Repeat every 10 years  Lung Cancer Screening: (Low Dose CT Chest  recommended if Age 74-80 years, 38 pack-year currently smoking OR have quit w/in 15years.) does not qualify.     Additional Screening:  Hepatitis C Screening: does qualify; Completed 2/30/20  Vision Screening: Recommended annual ophthalmology exams for early detection of glaucoma and other disorders of the eye. Is the patient up to date with their annual eye exam?  Yes  Who is the provider or what is the name of the office in which the patient attends annual eye exams? Dr. Gloriann Loan   Dental Screening: Recommended annual dental exams for proper oral hygiene  Community Resource Referral / Chronic Care Management: CRR required this visit?  No   CCM required this visit?  No      Plan:     I have personally reviewed and noted the following in the patients chart:   Medical and social history Use of alcohol, tobacco or illicit drugs  Current medications and supplements including opioid prescriptions. Patient is not currently taking opioid prescriptions. Functional ability and status Nutritional status Physical activity Advanced directives List of other physicians Hospitalizations, surgeries, and ER visits in previous 12 months Vitals Screenings to include cognitive, depression, and falls Referrals and appointments  In addition, I have reviewed and discussed with patient certain preventive protocols, quality metrics, and best practice recommendations. A written personalized care plan for preventive services as well as general preventive health recommendations were provided to patient.   Due to this being a telephonic visit, the after visit summary with patients personalized plan was offered to patient via mail or my-chart. Patient would like to access on my-chart.     Loma Messing, LPN   [id number]   Nurse Health Advisor  Nurse Notes: none

## 2022-01-15 ENCOUNTER — Ambulatory Visit (INDEPENDENT_AMBULATORY_CARE_PROVIDER_SITE_OTHER): Payer: No Typology Code available for payment source

## 2022-01-15 VITALS — Ht 70.0 in | Wt 187.0 lb

## 2022-01-15 DIAGNOSIS — Z0000 Encounter for general adult medical examination without abnormal findings: Secondary | ICD-10-CM | POA: Diagnosis not present

## 2022-01-15 NOTE — Patient Instructions (Signed)
William Richardson , Thank you for taking time to complete your Medicare Wellness Visit. I appreciate your ongoing commitment to your health goals. Please review the following plan we discussed and let me know if I can assist you in the future.   Screening recommendations/referrals: Colonoscopy: up to date, last completed 11/18/17 due 11/19/27 Recommended yearly ophthalmology/optometry visit for glaucoma screening and checkup Recommended yearly dental visit for hygiene and checkup  Vaccinations: Influenza vaccine: up to date  Pneumococcal vaccine: up to date  Tdap vaccine: up to date completed 10/01/15, due 09/30/25 Shingles vaccine: up to date   Covid-19: newest booster available at your next appointment   Advanced directives: Please bring a copy of Living Will and/or [place] for your chart.   Conditions/risks identified: see problem list   Next appointment: Follow up in one year for your annual wellness visit.   Preventive Care 68 Years and Older, Male Preventive care refers to lifestyle choices and visits with your health care provider that can promote health and wellness. What does preventive care include? A yearly physical exam. This is also called an annual well check. Dental exams once or twice a year. Routine eye exams. Ask your health care provider how often you should have your eyes checked. Personal lifestyle choices, including: Daily care of your teeth and gums. Regular physical activity. Eating a healthy diet. Avoiding tobacco and drug use. Limiting alcohol use. Practicing safe sex. Taking low doses of aspirin every day. Taking vitamin and mineral supplements as recommended by your health care provider. What happens during an annual well check? The services and screenings done by your health care provider during your annual well check will depend on your age, overall health, lifestyle risk factors, and family history of disease. Counseling  Your  health care provider may ask you questions about your: Alcohol use. Tobacco use. Drug use. Emotional well-being. Home and relationship well-being. Sexual activity. Eating habits. History of falls. Memory and ability to understand (cognition). Work and work Statistician. Screening  You may have the following tests or measurements: Height, weight, and BMI. Blood pressure. Lipid and cholesterol levels. These may be checked every 5 years, or more frequently if you are over 78 years old. Skin check. Lung cancer screening. You may have this screening every year starting at age 56 if you have a 30-pack-year history of smoking and currently smoke or have quit within the past 15 years. Fecal occult blood test (FOBT) of the stool. You may have this test every year starting at age 51. Flexible sigmoidoscopy or colonoscopy. You may have a sigmoidoscopy every 5 years or a colonoscopy every 10 years starting at age 71. Prostate cancer screening. Recommendations will vary depending on your family history and other risks. Hepatitis C blood test. Hepatitis B blood test. Sexually transmitted disease (STD) testing. Diabetes screening. This is done by checking your blood sugar (glucose) after you have not eaten for a while (fasting). You may have this done every 1-3 years. Abdominal aortic aneurysm (AAA) screening. You may need this if you are a current or former smoker. Osteoporosis. You may be screened starting at age 49 if you are at high risk. Talk with your health care provider about your test results, treatment options, and if necessary, the need for more tests. Vaccines  Your health care provider may recommend certain vaccines, such as: Influenza vaccine. This is recommended every year. Tetanus, diphtheria, and acellular pertussis (Tdap, Td) vaccine. You may need a Td booster every 10  years. Zoster vaccine. You may need this after age 28. Pneumococcal 13-valent conjugate (PCV13) vaccine. One dose  is recommended after age 38. Pneumococcal polysaccharide (PPSV23) vaccine. One dose is recommended after age 90. Talk to your health care provider about which screenings and vaccines you need and how often you need them. This information is not intended to replace advice given to you by your health care provider. Make sure you discuss any questions you have with your health care provider. Document Released: 01/02/2016 Document Revised: 08/25/2016 Document Reviewed: 10/07/2015 Elsevier Interactive Patient Education  2017 [place] Prevention in the Home Falls can cause injuries. They can happen to people of all ages. There are many things you can do to make your home safe and to help prevent falls. What can I do on the outside of my home? Regularly fix the edges of walkways and driveways and fix any cracks. Remove anything that might make you trip as you walk through a door, such as a raised step or threshold. Trim any bushes or trees on the path to your home. Use bright outdoor lighting. Clear any walking paths of anything that might make someone trip, such as rocks or tools. Regularly check to see if handrails are loose or broken. Make sure that both sides of any steps have handrails. Any raised decks and porches should have guardrails on the edges. Have any leaves, snow, or ice cleared regularly. Use sand or salt on walking paths during winter. Clean up any spills in your garage right away. This includes oil or grease spills. What can I do in the bathroom? Use night lights. Install grab bars by the toilet and in the tub and shower. Do not use towel bars as grab bars. Use non-skid mats or decals in the tub or shower. If you need to sit down in the shower, use a plastic, non-slip stool. Keep the floor dry. Clean up any water that spills on the floor as soon as it happens. Remove soap buildup in the tub or shower regularly. Attach bath mats securely with double-sided non-slip rug  tape. Do not have throw rugs and other things on the floor that can make you trip. What can I do in the bedroom? Use night lights. Make sure that you have a light by your bed that is easy to reach. Do not use any sheets or blankets that are too big for your bed. They should not hang down onto the floor. Have a firm chair that has side arms. You can use this for support while you get dressed. Do not have throw rugs and other things on the floor that can make you trip. What can I do in the kitchen? Clean up any spills right away. Avoid walking on wet floors. Keep items that you use a lot in easy-to-reach places. If you need to reach something above you, use a strong step stool that has a grab bar. Keep electrical cords out of the way. Do not use floor polish or wax that makes floors slippery. If you must use wax, use non-skid floor wax. Do not have throw rugs and other things on the floor that can make you trip. What can I do with my stairs? Do not leave any items on the stairs. Make sure that there are handrails on both sides of the stairs and use them. Fix handrails that are broken or loose. Make sure that handrails are as long as the stairways. Check any carpeting to make sure  that it is firmly attached to the stairs. Fix any carpet that is loose or worn. Avoid having throw rugs at the top or bottom of the stairs. If you do have throw rugs, attach them to the floor with carpet tape. Make sure that you have a light switch at the top of the stairs and the bottom of the stairs. If you do not have them, ask someone to add them for you. What else can I do to help prevent falls? Wear shoes that: Do not have high heels. Have rubber bottoms. Are comfortable and fit you well. Are closed at the toe. Do not wear sandals. If you use a stepladder: Make sure that it is fully opened. Do not climb a closed stepladder. Make sure that both sides of the stepladder are locked into place. Ask someone to  hold it for you, if possible. Clearly mark and make sure that you can see: Any grab bars or handrails. First and last steps. Where the edge of each step is. Use tools that help you move around (mobility aids) if they are needed. These include: Canes. Walkers. Scooters. Crutches. Turn on the lights when you go into a dark area. Replace any light bulbs as soon as they burn out. Set up your furniture so you have a clear path. Avoid moving your furniture around. If any of your floors are uneven, fix them. If there are any pets around you, be aware of where they are. Review your medicines with your doctor. Some medicines can make you feel dizzy. This can increase your chance of falling. Ask your doctor what other things that you can do to help prevent falls. This information is not intended to replace advice given to you by your health care provider. Make sure you discuss any questions you have with your health care provider. Document Released: 10/02/2009 Document Revised: 05/13/2016 Document Reviewed: 01/10/2015 Elsevier Interactive Patient Education  2017 [employer].

## 2022-01-18 DIAGNOSIS — G4733 Obstructive sleep apnea (adult) (pediatric): Secondary | ICD-10-CM | POA: Diagnosis not present

## 2022-01-21 DIAGNOSIS — G4733 Obstructive sleep apnea (adult) (pediatric): Secondary | ICD-10-CM | POA: Diagnosis not present

## 2022-01-29 DIAGNOSIS — G4733 Obstructive sleep apnea (adult) (pediatric): Secondary | ICD-10-CM | POA: Diagnosis not present

## 2022-03-10 ENCOUNTER — Other Ambulatory Visit: Payer: Self-pay | Admitting: Family Medicine

## 2022-03-10 DIAGNOSIS — D751 Secondary polycythemia: Secondary | ICD-10-CM

## 2022-03-10 DIAGNOSIS — Z125 Encounter for screening for malignant neoplasm of prostate: Secondary | ICD-10-CM

## 2022-03-10 DIAGNOSIS — E7800 Pure hypercholesterolemia, unspecified: Secondary | ICD-10-CM

## 2022-03-11 ENCOUNTER — Other Ambulatory Visit: Payer: Self-pay

## 2022-03-11 ENCOUNTER — Other Ambulatory Visit: Payer: Medicare HMO

## 2022-03-11 ENCOUNTER — Other Ambulatory Visit (INDEPENDENT_AMBULATORY_CARE_PROVIDER_SITE_OTHER): Payer: No Typology Code available for payment source

## 2022-03-11 DIAGNOSIS — D751 Secondary polycythemia: Secondary | ICD-10-CM

## 2022-03-11 DIAGNOSIS — E7800 Pure hypercholesterolemia, unspecified: Secondary | ICD-10-CM | POA: Diagnosis not present

## 2022-03-11 DIAGNOSIS — Z125 Encounter for screening for malignant neoplasm of prostate: Secondary | ICD-10-CM

## 2022-03-11 LAB — LIPID PANEL
Cholesterol: 180 mg/dL (ref 0–200)
HDL: 48.6 mg/dL (ref >39.00)
LDL Cholesterol: 110 mg/dL — ABNORMAL HIGH (ref 0–99)
NonHDL: 131.89
Total CHOL/HDL Ratio: 4
Triglycerides: 110 mg/dL (ref 0.0–149.0)
VLDL: 22 mg/dL (ref 0.0–40.0)

## 2022-03-11 LAB — CBC WITH DIFFERENTIAL/PLATELET
Basophils Absolute: 0.1 10*3/uL (ref 0.0–0.1)
Basophils Relative: 1.1 % (ref 0.0–3.0)
Eosinophils Absolute: 0.2 10*3/uL (ref 0.0–0.7)
Eosinophils Relative: 2.6 % (ref 0.0–5.0)
HCT: 50.4 % (ref 39.0–52.0)
Hemoglobin: 16.9 g/dL (ref 13.0–17.0)
Lymphocytes Relative: 18 % (ref 12.0–46.0)
Lymphs Abs: 1.1 10*3/uL (ref 0.7–4.0)
MCHC: 33.6 g/dL (ref 30.0–36.0)
MCV: 89.8 fl (ref 78.0–100.0)
Monocytes Absolute: 0.7 10*3/uL (ref 0.1–1.0)
Monocytes Relative: 11.4 % (ref 3.0–12.0)
Neutro Abs: 4.2 10*3/uL (ref 1.4–7.7)
Neutrophils Relative %: 66.9 % (ref 43.0–77.0)
Platelets: 194 10*3/uL (ref 150.0–400.0)
RBC: 5.61 Mil/uL (ref 4.22–5.81)
RDW: 13.4 % (ref 11.5–15.5)
WBC: 6.2 10*3/uL (ref 4.0–10.5)

## 2022-03-11 LAB — COMPREHENSIVE METABOLIC PANEL
ALT: 24 U/L (ref 0–53)
AST: 21 U/L (ref 0–37)
Albumin: 4.8 g/dL (ref 3.5–5.2)
Alkaline Phosphatase: 79 U/L (ref 39–117)
BUN: 13 mg/dL (ref 6–23)
CO2: 32 mEq/L (ref 19–32)
Calcium: 9.7 mg/dL (ref 8.4–10.5)
Chloride: 104 mEq/L (ref 96–112)
Creatinine, Ser: 1.22 mg/dL (ref 0.40–1.50)
GFR: 61.46 mL/min (ref >60.00)
Glucose, Bld: 100 mg/dL — ABNORMAL HIGH (ref 70–99)
Potassium: 4.5 mEq/L (ref 3.5–5.1)
Sodium: 142 mEq/L (ref 135–145)
Total Bilirubin: 0.7 mg/dL (ref 0.2–1.2)
Total Protein: 7.1 g/dL (ref 6.0–8.3)

## 2022-03-11 LAB — PSA: PSA: 0.74 ng/mL (ref 0.10–4.00)

## 2022-03-17 ENCOUNTER — Encounter: Payer: Self-pay | Admitting: Family Medicine

## 2022-03-17 ENCOUNTER — Ambulatory Visit (INDEPENDENT_AMBULATORY_CARE_PROVIDER_SITE_OTHER): Payer: No Typology Code available for payment source | Admitting: Family Medicine

## 2022-03-17 VITALS — BP 122/76 | HR 57 | Temp 97.5°F | Ht 69.5 in | Wt 192.0 lb

## 2022-03-17 DIAGNOSIS — M25512 Pain in left shoulder: Secondary | ICD-10-CM

## 2022-03-17 DIAGNOSIS — R351 Nocturia: Secondary | ICD-10-CM | POA: Diagnosis not present

## 2022-03-17 DIAGNOSIS — Z0000 Encounter for general adult medical examination without abnormal findings: Secondary | ICD-10-CM | POA: Insufficient documentation

## 2022-03-17 DIAGNOSIS — Z7189 Other specified counseling: Secondary | ICD-10-CM

## 2022-03-17 DIAGNOSIS — E7800 Pure hypercholesterolemia, unspecified: Secondary | ICD-10-CM | POA: Diagnosis not present

## 2022-03-17 DIAGNOSIS — G8929 Other chronic pain: Secondary | ICD-10-CM | POA: Diagnosis not present

## 2022-03-17 DIAGNOSIS — G4700 Insomnia, unspecified: Secondary | ICD-10-CM

## 2022-03-17 DIAGNOSIS — G4733 Obstructive sleep apnea (adult) (pediatric): Secondary | ICD-10-CM | POA: Diagnosis not present

## 2022-03-17 DIAGNOSIS — Z9989 Dependence on other enabling machines and devices: Secondary | ICD-10-CM

## 2022-03-17 MED ORDER — MELOXICAM 15 MG PO TABS: 15.0000 mg | ORAL_TABLET | ORAL | 3 refills | Status: DC | PRN

## 2022-03-17 MED ORDER — TAMSULOSIN HCL 0.4 MG PO CAPS: 0.4000 mg | ORAL_CAPSULE | Freq: Every day | ORAL | 3 refills | Status: DC

## 2022-03-17 NOTE — Progress Notes (Addendum)
? ? Patient ID: William Richardson., male    DOB: [birth year], 66 y.o.   MRN: [id number] ? ?This visit was conducted in person. ? ?BP 122/76   Pulse (!) 57   Temp (!) 97.5 ?F (36.4 ?C) (Temporal)   Ht 5' 9.5" (1.765 m)   Wt 192 lb (87.1 kg)   SpO2 95%   BMI 27.95 kg/m?   ? ?CC: CPE ?Subjective:  ? ?HPI: ?William Richardson. is a 66 y.o. male presenting on 03/17/2022 for Annual Exam [facility] prt 2.  Wants to discuss better option for sleep vs Benadryl. ) ? ? ?Saw health advisor 12/2021 for medicare wellness visit. Note reviewed.  Cognitive function not assessed.  ? ?No results found.  ?Flowsheet Row Clinical Support from 01/15/2022 in [place] [facility]  ?PHQ-2 Total Score 0  ? ?  ?  ? ?  01/15/2022  ? 10:32 AM 01/14/2021  ? 10:55 AM 01/22/2019  ?  9:15 AM 10/13/2017  ?  3:11 PM  ?Fall Risk   ?Falls in the past year? 0 0 0 No  ?Number falls in past yr: 0 0 0   ?Injury with Fall? 0 0 0   ?Risk for fall due to : No Fall Risks No Fall Risks    ?Follow up Falls prevention discussed Falls evaluation completed;Falls prevention discussed    ? ?Stopped zzquil with clearing mentation in the mornings noted however notes ongoing difficulty with both falling asleep and staying asleep. Voids prior to getting in bed. Waking up several times with nocturia.  ?Bedtime 11am, wakes up at 6:30am.  ?OSA - on CPAP 5-20 cm H2O. Saw Dr Richardson Landry ENT.  ? ?Preventative: ?Colonoscopy 10/2017 - [facility], int hem, rpt 10 yrs Vicente Males) ?Prostate cancer screening - nocturia x3, no significant daytime symptoms or weakening of stream.  ?Lung cancer screening - not eligible  ?Flu shot - flu shot ?COVID shot - did not complete  ?Tdap 2016 ?Prevnar-13 02/2020, pneumovax 11/2021 ?Shingrix - 07/2021, 2nd at [facility] 12/2021  ?Advanced directive discussion - has this at home. Wife is HCPOA. Asked to bring [place] a copy  ?Seat belt use discussed ?Sunscreen use discussed. No changing moles on skin. Sees derm yearly  ?Smoking - none ?Alcohol  -  none ?Dentist - q6 mo ?Eye exam - yearly ?Bowel - no constipation ?Bladder -  no incontinence ? ?From: the area ?Living: with wife William Richardson [phone]) ?Work: former Administrator ?Family: William Richardson (Daughter), 3 grandchildren ?Enjoys: vacation, travel ?Exercise: not currently, needs to get back into the gym - has a membership at [employer] ?Diet: not great, skips breakfast, eats when hungry  ?   ? ?Relevant past medical, surgical, family and social history reviewed and updated as indicated. Interim medical history since our last visit reviewed. ?Allergies and medications reviewed and updated. ?Outpatient Medications Prior to Visit  ?Medication Sig Dispense Refill  ? fluticasone (FLONASE) 50 MCG/ACT nasal spray Place 2 sprays into both nostrils daily. (Patient taking differently: Place 2 sprays into both nostrils as needed.) 3 g 12  ? valACYclovir (VALTREX) 500 MG tablet Take 4 tablets (2,000 mg total) by mouth 2 (two) times daily as needed (x1 day for fever blister).    ? meloxicam (MOBIC) 15 MG tablet TAKE 1 TABLET BY MOUTH AS NEEDED 60 tablet 0  ? diphenhydrAMINE (BENADRYL) 25 MG tablet Take 0.5 tablets (12.5 mg total) by mouth at bedtime as needed for sleep.    ? Multiple Vitamin (MULTIVITAMIN  ADULT) TABS Take 1 tablet by mouth daily.    ? ?No facility-administered medications prior to visit.  ?  ? ?Per HPI unless specifically indicated in ROS section below ?Review of Systems  ?Constitutional:  Negative for activity change, appetite change, chills, fatigue, fever and unexpected weight change.  ?HENT:  Negative for hearing loss.   ?Eyes:  Negative for visual disturbance.  ?Respiratory:  Negative for cough, chest tightness, shortness of breath and wheezing.   ?Cardiovascular:  Negative for chest pain, palpitations and leg swelling.  ?Gastrointestinal:  Negative for abdominal distention, abdominal pain, blood in stool, constipation, diarrhea, nausea and vomiting.  ?Genitourinary:  Negative for difficulty urinating  and hematuria.  ?Musculoskeletal:  Negative for arthralgias, myalgias and neck pain.  ?Skin:  Negative for rash.  ?Neurological:  Negative for dizziness, seizures, syncope and headaches.  ?Hematological:  Negative for adenopathy. Does not bruise/bleed easily.  ?Psychiatric/Behavioral:  Negative for dysphoric mood. The patient is not nervous/anxious.   ? ?Objective:  ?BP 122/76   Pulse (!) 57   Temp (!) 97.5 ?F (36.4 ?C) (Temporal)   Ht 5' 9.5" (1.765 m)   Wt 192 lb (87.1 kg)   SpO2 95%   BMI 27.95 kg/m?   ?Wt Readings from Last 3 Encounters:  ?03/17/22 192 lb (87.1 kg)  ?01/15/22 187 lb (84.8 kg)  ?12/09/21 194 lb 4 oz (88.1 kg)  ?  ?  ?Physical Exam ?Vitals and nursing note reviewed.  ?Constitutional:   ?   General: He is not in acute distress. ?   Appearance: Normal appearance. He is well-developed. He is not ill-appearing.  ?HENT:  ?   Head: Normocephalic and atraumatic.  ?   Right Ear: Hearing, tympanic membrane, ear canal and external ear normal.  ?   Left Ear: Hearing, tympanic membrane, ear canal and external ear normal.  ?   Mouth/Throat:  ?   Mouth: Mucous membranes are moist.  ?   Pharynx: Oropharynx is clear. No oropharyngeal exudate or posterior oropharyngeal erythema.  ?Eyes:  ?   General: No scleral icterus. ?   Extraocular Movements: Extraocular movements intact.  ?   Conjunctiva/sclera: Conjunctivae normal.  ?   Pupils: Pupils are equal, round, and reactive to light.  ?   Comments: S/p bilat cataract extraction  ?Neck:  ?   Thyroid: No thyroid mass or thyromegaly.  ?   Vascular: No carotid bruit.  ?Cardiovascular:  ?   Rate and Rhythm: Normal rate and regular rhythm.  ?   Pulses: Normal pulses.     ?     Radial pulses are 2+ on the right side and 2+ on the left side.  ?   Heart sounds: Normal heart sounds. No murmur heard. ?Pulmonary:  ?   Effort: Pulmonary effort is normal. No respiratory distress.  ?   Breath sounds: Normal breath sounds. No wheezing, rhonchi or rales.  ?Abdominal:  ?    General: Bowel sounds are normal. There is no distension.  ?   Palpations: Abdomen is soft. There is no mass.  ?   Tenderness: There is no abdominal tenderness. There is no guarding or rebound.  ?   Hernia: No hernia is present.  ?Genitourinary: ?   Prostate: Normal. Not enlarged (25gm), not tender and no nodules present.  ?   Rectum: Normal. No mass, tenderness, anal fissure, external hemorrhoid or internal hemorrhoid. Normal anal tone.  ?Musculoskeletal:     ?   General: Normal range of motion.  ?  Cervical back: Normal range of motion and neck supple.  ?   Right lower leg: No edema.  ?   Left lower leg: No edema.  ?Lymphadenopathy:  ?   Cervical: No cervical adenopathy.  ?Skin: ?   General: Skin is warm and dry.  ?   Findings: No rash.  ?Neurological:  ?   General: No focal deficit present.  ?   Mental Status: He is alert and oriented to person, place, and time.  ?   Comments:  ?Recall 3/3 ?Calculation 5/5 DLROW  ?Psychiatric:     ?   Mood and Affect: Mood normal.     ?   Behavior: Behavior normal.     ?   Thought Content: Thought content normal.     ?   Judgment: Judgment normal.  ? ?   ?Results for orders placed or performed in visit on 03/11/22  ?CBC with Differential/Platelet  ?Result Value Ref Range  ? WBC 6.2 4.0 - 10.5 K/uL  ? RBC 5.61 4.22 - 5.81 Mil/uL  ? Hemoglobin 16.9 13.0 - 17.0 g/dL  ? HCT 50.4 39.0 - 52.0 %  ? MCV 89.8 78.0 - 100.0 fl  ? MCHC 33.6 30.0 - 36.0 g/dL  ? RDW 13.4 11.5 - 15.5 %  ? Platelets 194.0 150.0 - 400.0 K/uL  ? Neutrophils Relative % 66.9 43.0 - 77.0 %  ? Lymphocytes Relative 18.0 12.0 - 46.0 %  ? Monocytes Relative 11.4 3.0 - 12.0 %  ? Eosinophils Relative 2.6 0.0 - 5.0 %  ? Basophils Relative 1.1 0.0 - 3.0 %  ? Neutro Abs 4.2 1.4 - 7.7 K/uL  ? Lymphs Abs 1.1 0.7 - 4.0 K/uL  ? Monocytes Absolute 0.7 0.1 - 1.0 K/uL  ? Eosinophils Absolute 0.2 0.0 - 0.7 K/uL  ? Basophils Absolute 0.1 0.0 - 0.1 K/uL  ?PSA  ?Result Value Ref Range  ? PSA 0.74 0.10 - 4.00 ng/mL  ?Comprehensive  metabolic panel  ?Result Value Ref Range  ? Sodium 142 135 - 145 mEq/L  ? Potassium 4.5 3.5 - 5.1 mEq/L  ? Chloride 104 96 - 112 mEq/L  ? CO2 32 19 - 32 mEq/L  ? Glucose, Bld 100 (H) 70 - 99 mg/dL  ? BUN 13 6 - 23 mg/dL  ? Creatinine,

## 2022-03-17 NOTE — Assessment & Plan Note (Addendum)
Sleep study 07/2021 through ENT Dr Richardson Landry.  ?

## 2022-03-17 NOTE — Assessment & Plan Note (Signed)
Continue meloxicam '15mg'$  QD PRN. Reviewed limiting use, reviewed avoiding NSAIDs when on this.  ?

## 2022-03-17 NOTE — Assessment & Plan Note (Signed)
Both sleep initiation and maintenance insomnia.  ?From discussion, sounds like sleep maintenance issue largely driven by nocturia. See above - will try to improve nocturia and consequently improve sleep maintenance insomnia. ?He has stopped zzzquil and subsequently may have noted improvement in morning mentation. ?

## 2022-03-17 NOTE — Assessment & Plan Note (Signed)
Preventative protocols reviewed and updated unless pt declined. Discussed healthy diet and lifestyle.  

## 2022-03-17 NOTE — Patient Instructions (Addendum)
Bring [place] a copy of your living will to update your chart.  ?Try flomax 0.'4mg'$  nightly to help decrease night time urination. Let us know how you do with this.  ?Sleep hygiene checklist: ?1. Avoid naps during the day ?2. Avoid stimulants such as caffeine and nicotine. Avoid bedtime alcohol (it can speed onset of sleep but the body's metabolism can cause awakenings). ?3. All forms of exercise help ensure sound sleep - limit vigorous exercise to morning or late afternoon ?4. Avoid food too close to bedtime including chocolate (which contains caffeine) ?5. Soak up natural light ?6. Establish regular bedtime routine. ?7. Associate bed with sleep - avoid TV, computer or phone, reading while in bed. ?8. Ensure pleasant, relaxing sleep environment - quiet, dark, cool room.  ? ?You are doing well today ?Return as needed or in 1 year for next physical.  ? ?Health Maintenance After Age 42 ?After age 62, you are at a higher risk for certain long-term diseases and infections as well as injuries from falls. Falls are a major cause of broken bones and head injuries in people who are older than age 75. Getting regular preventive care can help to keep you healthy and well. Preventive care includes getting regular testing and making lifestyle changes as recommended by your health care provider. Talk with your health care provider about: ?Which screenings and tests you should have. A screening is a test that checks for a disease when you have no symptoms. ?A diet and exercise plan that is right for you. ?What should I know about screenings and tests to prevent falls? ?Screening and testing are the best ways to find a health problem early. Early diagnosis and treatment give you the best chance of managing medical conditions that are common after age 61. Certain conditions and lifestyle choices may make you more likely to have a fall. Your health care provider may recommend: ?Regular vision checks. Poor vision and conditions such as  cataracts can make you more likely to have a fall. If you wear glasses, make sure to get your prescription updated if your vision changes. ?Medicine review. Work with your health care provider to regularly review all of the medicines you are taking, including over-the-counter medicines. Ask your health care provider about any side effects that may make you more likely to have a fall. Tell your health care provider if any medicines that you take make you feel dizzy or sleepy. ?Strength and balance checks. Your health care provider may recommend certain tests to check your strength and balance while standing, walking, or changing positions. ?Foot health exam. Foot pain and numbness, as well as not wearing proper footwear, can make you more likely to have a fall. ?Screenings, including: ?Osteoporosis screening. Osteoporosis is a condition that causes the bones to get weaker and break more easily. ?Blood pressure screening. Blood pressure changes and medicines to control blood pressure can make you feel dizzy. ?Depression screening. You may be more likely to have a fall if you have a fear of falling, feel depressed, or feel unable to do activities that you used to do. ?Alcohol use screening. Using too much alcohol can affect your balance and may make you more likely to have a fall. ?Follow these instructions at home: ?Lifestyle ?Do not drink alcohol if: ?Your health care provider tells you not to drink. ?If you drink alcohol: ?Limit how much you have to: ?0-1 drink a day for women. ?0-2 drinks a day for men. ?Know how much alcohol is  in your drink. In the [place] one drink equals one 12 oz bottle of beer (355 mL), one 5 oz glass of wine (148 mL), or one 1? oz glass of hard liquor (44 mL). ?Do not use any products that contain nicotine or tobacco. These products include cigarettes, chewing tobacco, and vaping devices, such as e-cigarettes. If you need help quitting, ask your health care provider. ?Activity ? ?Follow a  regular exercise program to stay fit. This will help you maintain your balance. Ask your health care provider what types of exercise are appropriate for you. ?If you need a cane or walker, use it as recommended by your health care provider. ?Wear supportive shoes that have nonskid soles. ?Safety ? ?Remove any tripping hazards, such as rugs, cords, and clutter. ?Install safety equipment such as grab bars in bathrooms and safety rails on stairs. ?Keep rooms and walkways well-lit. ?General instructions ?Talk with your health care provider about your risks for falling. Tell your health care provider if: ?You fall. Be sure to tell your health care provider about all falls, even ones that seem minor. ?You feel dizzy, tiredness (fatigue), or off-balance. ?Take over-the-counter and prescription medicines only as told by your health care provider. These include supplements. ?Eat a healthy diet and maintain a healthy weight. A healthy diet includes low-fat dairy products, low-fat (lean) meats, and fiber from whole grains, beans, and lots of fruits and vegetables. ?Stay current with your vaccines. ?Schedule regular health, dental, and eye exams. ?Summary ?Having a healthy lifestyle and getting preventive care can help to protect your health and wellness after age 26. ?Screening and testing are the best way to find a health problem early and help you avoid having a fall. Early diagnosis and treatment give you the best chance for managing medical conditions that are more common for people who are older than age 71. ?Falls are a major cause of broken bones and head injuries in people who are older than age 17. Take precautions to prevent a fall at home. ?Work with your health care provider to learn what changes you can make to improve your health and wellness and to prevent falls. ?This information is not intended to replace advice given to you by your health care provider. Make sure you discuss any questions you have with your  health care provider. ?Document Revised: 04/27/2021 Document Reviewed: 04/27/2021 ?Elsevier Patient Education ? [place]. ? ?

## 2022-03-17 NOTE — Assessment & Plan Note (Signed)
Symptoms suspicious for BPH related nocturia although no obvious BPH on exam. Regardless, reasonable to try flomax 0.'4mg'$  nightly - sent to pharmacy.  ?

## 2022-03-17 NOTE — Assessment & Plan Note (Signed)
Chronic, not on statin. Reviewed diet choices to improve LDL cholesterol control. Consider adding statin in the future.  ?The 10-year ASCVD risk score (Arnett DK, et al., 2019) is: 12.9% ?  Values used to calculate the score: ?    Age: 66 years ?    Sex: Male ?    Is Non-Hispanic African American: No ?    Diabetic: No ?    Tobacco smoker: No ?    Systolic Blood Pressure: 979 mmHg ?    Is BP treated: No ?    HDL Cholesterol: 48.6 mg/dL ?    Total Cholesterol: 180 mg/dL  ?

## 2022-03-17 NOTE — Assessment & Plan Note (Signed)
Advanced directive discussion - has this at home. Wife is HCPOA. Asked to bring [place] a copy  ?

## 2022-04-30 MED ORDER — TAMSULOSIN HCL 0.4 MG PO CAPS: 0.4000 mg | ORAL_CAPSULE | Freq: Every day | ORAL | 2 refills | Status: DC

## 2022-05-11 DIAGNOSIS — L820 Inflamed seborrheic keratosis: Secondary | ICD-10-CM | POA: Diagnosis not present

## 2022-05-11 DIAGNOSIS — L570 Actinic keratosis: Secondary | ICD-10-CM | POA: Diagnosis not present

## 2022-05-11 DIAGNOSIS — Z85828 Personal history of other malignant neoplasm of skin: Secondary | ICD-10-CM | POA: Diagnosis not present

## 2022-05-11 DIAGNOSIS — D2261 Melanocytic nevi of right upper limb, including shoulder: Secondary | ICD-10-CM | POA: Diagnosis not present

## 2022-05-11 DIAGNOSIS — D2271 Melanocytic nevi of right lower limb, including hip: Secondary | ICD-10-CM | POA: Diagnosis not present

## 2022-05-11 DIAGNOSIS — D2262 Melanocytic nevi of left upper limb, including shoulder: Secondary | ICD-10-CM | POA: Diagnosis not present

## 2022-05-11 DIAGNOSIS — L538 Other specified erythematous conditions: Secondary | ICD-10-CM | POA: Diagnosis not present

## 2022-05-11 DIAGNOSIS — X32XXXA Exposure to sunlight, initial encounter: Secondary | ICD-10-CM | POA: Diagnosis not present

## 2022-06-14 ENCOUNTER — Ambulatory Visit (INDEPENDENT_AMBULATORY_CARE_PROVIDER_SITE_OTHER): Payer: No Typology Code available for payment source | Admitting: Family Medicine

## 2022-06-14 ENCOUNTER — Encounter: Payer: Self-pay | Admitting: Family Medicine

## 2022-06-14 VITALS — BP 116/80 | HR 62 | Temp 97.8°F | Ht 69.5 in | Wt 189.4 lb

## 2022-06-14 DIAGNOSIS — R202 Paresthesia of skin: Secondary | ICD-10-CM

## 2022-06-14 DIAGNOSIS — G4733 Obstructive sleep apnea (adult) (pediatric): Secondary | ICD-10-CM | POA: Diagnosis not present

## 2022-06-14 MED ORDER — FLUTICASONE PROPIONATE 50 MCG/ACT NA SUSP: 2.0000 | Freq: Every day | NASAL | 6 refills | Status: DC

## 2022-06-15 LAB — BASIC METABOLIC PANEL
BUN: 13 mg/dL (ref 6–23)
CO2: 28 mEq/L (ref 19–32)
Calcium: 9.6 mg/dL (ref 8.4–10.5)
Chloride: 104 mEq/L (ref 96–112)
Creatinine, Ser: 1.03 mg/dL (ref 0.40–1.50)
GFR: 75.17 mL/min (ref >60.00)
Glucose, Bld: 73 mg/dL (ref 70–99)
Potassium: 4.1 mEq/L (ref 3.5–5.1)
Sodium: 140 mEq/L (ref 135–145)

## 2022-06-15 LAB — VITAMIN B12: Vitamin B-12: 238 pg/mL (ref 211–911)

## 2022-06-15 LAB — TSH: TSH: 1.45 u[IU]/mL (ref 0.35–5.50)

## 2022-06-20 ENCOUNTER — Encounter: Payer: Self-pay | Admitting: Family Medicine

## 2022-06-20 LAB — EXTRA SPECIMEN

## 2022-06-20 LAB — VITAMIN B1

## 2022-06-21 ENCOUNTER — Other Ambulatory Visit: Payer: Self-pay | Admitting: Family Medicine

## 2022-06-21 MED ORDER — VITAMIN B-12 1000 MCG PO TABS: 1000.0000 ug | ORAL_TABLET | Freq: Every day | ORAL | Status: DC

## 2022-06-21 NOTE — Telephone Encounter (Signed)
Replied via [place]. Reasonable to offer B12 shot x1 in office. Vit B12 was 238.

## 2022-07-01 ENCOUNTER — Ambulatory Visit: Payer: No Typology Code available for payment source

## 2022-07-06 ENCOUNTER — Ambulatory Visit (INDEPENDENT_AMBULATORY_CARE_PROVIDER_SITE_OTHER): Payer: No Typology Code available for payment source | Admitting: *Deleted

## 2022-07-06 ENCOUNTER — Other Ambulatory Visit: Payer: Self-pay | Admitting: Family Medicine

## 2022-07-06 DIAGNOSIS — R202 Paresthesia of skin: Secondary | ICD-10-CM

## 2022-07-06 MED ORDER — CYANOCOBALAMIN 1000 MCG/ML IJ SOLN
1000.0000 ug | Freq: Once | INTRAMUSCULAR | Status: AC
  Administered 2022-07-06: 1000 ug via INTRAMUSCULAR

## 2022-07-06 MED ORDER — VITAMIN B-12 1000 MCG PO TABS: 1000.0000 ug | ORAL_TABLET | Freq: Every day | ORAL | 1 refills | Status: AC

## 2022-07-06 NOTE — Progress Notes (Signed)
Per orders of Dr. Danise Mina, injection of Vitamin B12 in right deltoid given by Lauralyn Primes. Patient tolerated injection well.

## 2022-07-14 DIAGNOSIS — H52213 Irregular astigmatism, bilateral: Secondary | ICD-10-CM | POA: Diagnosis not present

## 2022-07-15 DIAGNOSIS — G4733 Obstructive sleep apnea (adult) (pediatric): Secondary | ICD-10-CM | POA: Diagnosis not present

## 2022-08-19 DIAGNOSIS — D2271 Melanocytic nevi of right lower limb, including hip: Secondary | ICD-10-CM | POA: Diagnosis not present

## 2022-08-19 DIAGNOSIS — X32XXXA Exposure to sunlight, initial encounter: Secondary | ICD-10-CM | POA: Diagnosis not present

## 2022-08-19 DIAGNOSIS — L820 Inflamed seborrheic keratosis: Secondary | ICD-10-CM | POA: Diagnosis not present

## 2022-08-19 DIAGNOSIS — L538 Other specified erythematous conditions: Secondary | ICD-10-CM | POA: Diagnosis not present

## 2022-08-19 DIAGNOSIS — D2261 Melanocytic nevi of right upper limb, including shoulder: Secondary | ICD-10-CM | POA: Diagnosis not present

## 2022-08-19 DIAGNOSIS — D485 Neoplasm of uncertain behavior of skin: Secondary | ICD-10-CM | POA: Diagnosis not present

## 2022-08-19 DIAGNOSIS — Z85828 Personal history of other malignant neoplasm of skin: Secondary | ICD-10-CM | POA: Diagnosis not present

## 2022-08-19 DIAGNOSIS — C44319 Basal cell carcinoma of skin of other parts of face: Secondary | ICD-10-CM | POA: Diagnosis not present

## 2022-08-19 DIAGNOSIS — D2262 Melanocytic nevi of left upper limb, including shoulder: Secondary | ICD-10-CM | POA: Diagnosis not present

## 2022-08-19 DIAGNOSIS — L570 Actinic keratosis: Secondary | ICD-10-CM | POA: Diagnosis not present

## 2022-08-22 ENCOUNTER — Other Ambulatory Visit: Payer: Self-pay | Admitting: Family Medicine

## 2022-08-23 ENCOUNTER — Encounter: Payer: Self-pay | Admitting: Family Medicine

## 2022-08-24 MED ORDER — MELOXICAM 15 MG PO TABS
15.0000 mg | ORAL_TABLET | ORAL | 0 refills | Status: DC | PRN
Start: 2022-08-24 — End: 2023-03-23

## 2022-08-24 NOTE — Telephone Encounter (Signed)
Last refill 03/17/22 #30/3 Last office visit 06/14/22

## 2022-08-24 NOTE — Telephone Encounter (Signed)
ERx 

## 2022-10-11 ENCOUNTER — Encounter: Payer: Self-pay | Admitting: Family Medicine

## 2022-10-12 DIAGNOSIS — C44329 Squamous cell carcinoma of skin of other parts of face: Secondary | ICD-10-CM | POA: Diagnosis not present

## 2022-10-12 DIAGNOSIS — C44319 Basal cell carcinoma of skin of other parts of face: Secondary | ICD-10-CM | POA: Diagnosis not present

## 2022-10-14 DIAGNOSIS — M5418 Radiculopathy, sacral and sacrococcygeal region: Secondary | ICD-10-CM | POA: Diagnosis not present

## 2022-10-14 DIAGNOSIS — M5416 Radiculopathy, lumbar region: Secondary | ICD-10-CM | POA: Diagnosis not present

## 2022-10-14 DIAGNOSIS — M9903 Segmental and somatic dysfunction of lumbar region: Secondary | ICD-10-CM | POA: Diagnosis not present

## 2022-10-14 DIAGNOSIS — M9904 Segmental and somatic dysfunction of sacral region: Secondary | ICD-10-CM | POA: Diagnosis not present

## 2022-10-21 DIAGNOSIS — Z6827 Body mass index (BMI) 27.0-27.9, adult: Secondary | ICD-10-CM | POA: Diagnosis not present

## 2022-10-21 DIAGNOSIS — N401 Enlarged prostate with lower urinary tract symptoms: Secondary | ICD-10-CM | POA: Diagnosis not present

## 2022-10-21 DIAGNOSIS — F1021 Alcohol dependence, in remission: Secondary | ICD-10-CM | POA: Diagnosis not present

## 2022-10-21 DIAGNOSIS — Z008 Encounter for other general examination: Secondary | ICD-10-CM | POA: Diagnosis not present

## 2022-10-21 DIAGNOSIS — R351 Nocturia: Secondary | ICD-10-CM | POA: Diagnosis not present

## 2022-10-21 DIAGNOSIS — G4733 Obstructive sleep apnea (adult) (pediatric): Secondary | ICD-10-CM | POA: Diagnosis not present

## 2022-10-21 DIAGNOSIS — E663 Overweight: Secondary | ICD-10-CM | POA: Diagnosis not present

## 2022-11-18 DIAGNOSIS — M5416 Radiculopathy, lumbar region: Secondary | ICD-10-CM | POA: Diagnosis not present

## 2022-11-18 DIAGNOSIS — M9903 Segmental and somatic dysfunction of lumbar region: Secondary | ICD-10-CM | POA: Diagnosis not present

## 2022-11-18 DIAGNOSIS — M5418 Radiculopathy, sacral and sacrococcygeal region: Secondary | ICD-10-CM | POA: Diagnosis not present

## 2022-11-18 DIAGNOSIS — M9904 Segmental and somatic dysfunction of sacral region: Secondary | ICD-10-CM | POA: Diagnosis not present

## 2022-11-22 DIAGNOSIS — M5416 Radiculopathy, lumbar region: Secondary | ICD-10-CM | POA: Diagnosis not present

## 2022-11-22 DIAGNOSIS — M9904 Segmental and somatic dysfunction of sacral region: Secondary | ICD-10-CM | POA: Diagnosis not present

## 2022-11-22 DIAGNOSIS — M9903 Segmental and somatic dysfunction of lumbar region: Secondary | ICD-10-CM | POA: Diagnosis not present

## 2022-11-22 DIAGNOSIS — M5418 Radiculopathy, sacral and sacrococcygeal region: Secondary | ICD-10-CM | POA: Diagnosis not present

## 2022-11-23 DIAGNOSIS — Z0100 Encounter for examination of eyes and vision without abnormal findings: Secondary | ICD-10-CM | POA: Diagnosis not present

## 2022-11-25 DIAGNOSIS — M5418 Radiculopathy, sacral and sacrococcygeal region: Secondary | ICD-10-CM | POA: Diagnosis not present

## 2022-11-25 DIAGNOSIS — M9904 Segmental and somatic dysfunction of sacral region: Secondary | ICD-10-CM | POA: Diagnosis not present

## 2022-11-25 DIAGNOSIS — M9903 Segmental and somatic dysfunction of lumbar region: Secondary | ICD-10-CM | POA: Diagnosis not present

## 2022-11-25 DIAGNOSIS — M5416 Radiculopathy, lumbar region: Secondary | ICD-10-CM | POA: Diagnosis not present

## 2022-12-02 DIAGNOSIS — M9904 Segmental and somatic dysfunction of sacral region: Secondary | ICD-10-CM | POA: Diagnosis not present

## 2022-12-02 DIAGNOSIS — M5416 Radiculopathy, lumbar region: Secondary | ICD-10-CM | POA: Diagnosis not present

## 2022-12-02 DIAGNOSIS — M5418 Radiculopathy, sacral and sacrococcygeal region: Secondary | ICD-10-CM | POA: Diagnosis not present

## 2022-12-02 DIAGNOSIS — M9903 Segmental and somatic dysfunction of lumbar region: Secondary | ICD-10-CM | POA: Diagnosis not present

## 2022-12-03 DIAGNOSIS — G4730 Sleep apnea, unspecified: Secondary | ICD-10-CM | POA: Diagnosis not present

## 2022-12-07 DIAGNOSIS — M9904 Segmental and somatic dysfunction of sacral region: Secondary | ICD-10-CM | POA: Diagnosis not present

## 2022-12-07 DIAGNOSIS — M5416 Radiculopathy, lumbar region: Secondary | ICD-10-CM | POA: Diagnosis not present

## 2022-12-07 DIAGNOSIS — M5418 Radiculopathy, sacral and sacrococcygeal region: Secondary | ICD-10-CM | POA: Diagnosis not present

## 2022-12-07 DIAGNOSIS — M9903 Segmental and somatic dysfunction of lumbar region: Secondary | ICD-10-CM | POA: Diagnosis not present

## 2022-12-16 ENCOUNTER — Encounter: Payer: Self-pay | Admitting: Internal Medicine

## 2022-12-16 ENCOUNTER — Ambulatory Visit (INDEPENDENT_AMBULATORY_CARE_PROVIDER_SITE_OTHER): Payer: No Typology Code available for payment source | Admitting: Internal Medicine

## 2022-12-16 VITALS — BP 96/78 | HR 68 | Temp 97.4°F | Ht 69.5 in | Wt 197.0 lb

## 2022-12-16 DIAGNOSIS — J0110 Acute frontal sinusitis, unspecified: Secondary | ICD-10-CM | POA: Diagnosis not present

## 2022-12-16 MED ORDER — DOXYCYCLINE HYCLATE 100 MG PO TABS: 100.0000 mg | ORAL_TABLET | Freq: Two times a day (BID) | ORAL | 0 refills | Status: DC

## 2022-12-16 MED ORDER — BENZONATATE 200 MG PO CAPS: 200.0000 mg | ORAL_CAPSULE | Freq: Three times a day (TID) | ORAL | 0 refills | Status: DC | PRN

## 2022-12-16 MED ORDER — PREDNISONE 20 MG PO TABS: 40.0000 mg | ORAL_TABLET | Freq: Every day | ORAL | 0 refills | Status: DC

## 2022-12-16 NOTE — Assessment & Plan Note (Signed)
Will treat with doxy 100 bid for 7 days (he doesn't think amoxil worked ---?augmentin) Prednisone for 3 days due to the chest sound and sinus congestion Benzonatate for prn

## 2022-12-16 NOTE — Progress Notes (Signed)
Subjective:    Patient ID: William Richardson., male    DOB: [birth year], 66 y.o.   MRN: [id number]  HPI Here due to respiratory illness  Has been sick for a week or so Bad frontal headache Nasal congestion but nothing coming out Now coughing---and has post nasal drip with bad tasting stuff Has pain across upper chest with cough Wife sick also No fever, chills or sweats Not SOB--other than nasal congestion Yearly sinus infection No sore throat No ear pain  Nyquil and robitussin not helping  Current Outpatient Medications on File Prior to Visit  Medication Sig Dispense Refill   fluticasone (FLONASE) 50 MCG/ACT nasal spray Place 2 sprays into both nostrils daily. 16 g 6   meloxicam (MOBIC) 15 MG tablet Take 1 tablet (15 mg total) by mouth as needed. 90 tablet 0   valACYclovir (VALTREX) 500 MG tablet Take 4 tablets (2,000 mg total) by mouth 2 (two) times daily as needed (x1 day for fever blister).     vitamin B-12 (CYANOCOBALAMIN) 1000 MCG tablet Take 1 tablet (1,000 mcg total) by mouth daily. 90 tablet 1   No current facility-administered medications on file prior to visit.    No Known Allergies  Past Medical History:  Diagnosis Date   C. difficile diarrhea 06/15/2021   Started after 1 month doxy course. Rx Dificid 10d course 05/2021   Hypogonadism in male    Seasonal allergies    Sleep apnea     Past Surgical History:  Procedure Laterality Date   COLONOSCOPY WITH PROPOFOL N/A 11/18/2017   HP, int hem, rpt 10 yrs Vicente Males)   EYE SURGERY Bilateral    cataract   ROTATOR CUFF REPAIR     SPINE SURGERY      Family History  Problem Relation Age of Onset   Dementia Mother    Breast cancer Mother    Emphysema Father    Diabetes Brother     Social History   Socioeconomic History   Marital status: Married    Spouse name: [name]   Number of children: 1   Years of education: high school   Highest education level: Not on file  Occupational History   Not on  file  Tobacco Use   Smoking status: Never   Smokeless tobacco: Never  Vaping Use   Vaping Use: Never used  Substance and Sexual Activity   Alcohol use: No   Drug use: No   Sexual activity: Yes    Birth control/protection: Post-menopausal  Other Topics Concern   Not on file  Social History Narrative   03/10/20   From: the area   Living: with wife [name] (1981)   Work: former Administrator      Family: Health visitor (Daughter), 3 grandchildren      Enjoys: vacation, travel      Exercise: not currently, needs to get back into the gym - has a Higher education careers adviser at [employer]   Diet: not great, skips breakfast, eats when hungry       Safety   Seat belts: Yes    Guns: Yes  and secure   Safe in relationships: Yes    Social Determinants of Health   Financial Resource Strain: Low Risk  (01/15/2022)   Overall Financial Resource Strain (CARDIA)    Difficulty of Paying Living Expenses: Not hard at all  Food Insecurity: No Food Insecurity (01/15/2022)   Hunger Vital Sign    Worried About Running Out of Food in the Last  Year: Never true    [place] in the Last Year: Never true  Transportation Needs: No Transportation Needs (01/15/2022)   PRAPARE - Hydrologist (Medical): No    Lack of Transportation (Non-Medical): No  Physical Activity: Inactive (01/15/2022)   Exercise Vital Sign    Days of Exercise per Week: 0 days    Minutes of Exercise per Session: 0 min  Stress: No Stress Concern Present (01/15/2022)   [place]    Feeling of Stress : Not at all  Social Connections: [place] (01/15/2022)   Social Connection and Isolation Panel [NHANES]    Frequency of Communication with Friends and Family: Twice a week    Frequency of Social Gatherings with Friends and Family: Three times a week    Attends Religious Services: More than 4 times per year    Active Member of Clubs or  Organizations: Yes    Attends Archivist Meetings: More than 4 times per year    Marital Status: Married  Human resources officer Violence: Not At Risk (01/15/2022)   Humiliation, Afraid, Rape, and Kick questionnaire    Fear of Current or Ex-Partner: No    Emotionally Abused: No    Physically Abused: No    Sexually Abused: No   Review of Systems No N/V Eating okay     Objective:   Physical Exam Constitutional:      Appearance: Normal appearance.  HENT:     Head:     Comments: Mild frontal tenderness    Right Ear: Tympanic membrane and ear canal normal.     Left Ear: Tympanic membrane and ear canal normal.     Nose: Congestion present.     Mouth/Throat:     Pharynx: No oropharyngeal exudate or posterior oropharyngeal erythema.  Pulmonary:     Effort: Pulmonary effort is normal.     Breath sounds: No wheezing or rales.     Comments: Not tight but slight expiratory whistle Musculoskeletal:     Cervical back: Neck supple.  Lymphadenopathy:     Cervical: No cervical adenopathy.  Neurological:     Mental Status: He is alert.            Assessment & Plan:

## 2022-12-22 ENCOUNTER — Encounter: Payer: Self-pay | Admitting: Family Medicine

## 2022-12-23 MED ORDER — AZITHROMYCIN 250 MG PO TABS: ORAL_TABLET | ORAL | 0 refills | Status: DC

## 2023-01-19 ENCOUNTER — Telehealth: Payer: Self-pay | Admitting: Family Medicine

## 2023-01-19 NOTE — Telephone Encounter (Signed)
LVM for pt to rtn my call to schedule AWV with [facility].  

## 2023-02-09 ENCOUNTER — Ambulatory Visit (INDEPENDENT_AMBULATORY_CARE_PROVIDER_SITE_OTHER): Payer: PPO

## 2023-02-09 VITALS — Ht 70.0 in | Wt 189.0 lb

## 2023-02-09 DIAGNOSIS — Z0000 Encounter for general adult medical examination without abnormal findings: Secondary | ICD-10-CM | POA: Diagnosis not present

## 2023-02-09 DIAGNOSIS — Z1211 Encounter for screening for malignant neoplasm of colon: Secondary | ICD-10-CM

## 2023-02-09 NOTE — Patient Instructions (Signed)
William Richardson , Thank you for taking time to come for your Medicare Wellness Visit. I appreciate your ongoing commitment to your health goals. Please review the following plan we discussed and let me know if I can assist you in the future.   These are the goals we discussed:  Goals      Patient Stated     01/14/2021, I will maintain and continue medications as prescribed     Patient Stated     Would like to exercise more        This is a list of the screening recommended for you and due dates:  Health Maintenance  Topic Date Due   COVID-19 Vaccine (1) Never done   Zoster (Shingles) Vaccine (2 of 2) 10/05/2021   Flu Shot  07/20/2022   Medicare Annual Wellness Visit  02/10/2024   DTaP/Tdap/Td vaccine (2 - Td or Tdap) 09/30/2025   Colon Cancer Screening  11/19/2027   Pneumonia Vaccine  Completed   Hepatitis C Screening: USPSTF Recommendation to screen - Ages 18-79 yo.  Completed   HPV Vaccine  Aged Out    Advanced directives: yes  Conditions/risks identified: none  Next appointment: Follow up in one year for your annual wellness visit. 02/15/2024 @1$ :30 pm telephone   Preventive Care 65 Years and Older, Male  Preventive care refers to lifestyle choices and visits with your health care provider that can promote health and wellness. What does preventive care include? A yearly physical exam. This is also called an annual well check. Dental exams once or twice a year. Routine eye exams. Ask your health care provider how often you should have your eyes checked. Personal lifestyle choices, including: Daily care of your teeth and gums. Regular physical activity. Eating a healthy diet. Avoiding tobacco and drug use. Limiting alcohol use. Practicing safe sex. Taking low doses of aspirin every day. Taking vitamin and mineral supplements as recommended by your health care provider. What happens during an annual well check? The services and screenings done by your health care  provider during your annual well check will depend on your age, overall health, lifestyle risk factors, and family history of disease. Counseling  Your health care provider may ask you questions about your: Alcohol use. Tobacco use. Drug use. Emotional well-being. Home and relationship well-being. Sexual activity. Eating habits. History of falls. Memory and ability to understand (cognition). Work and work Statistician. Screening  You may have the following tests or measurements: Height, weight, and BMI. Blood pressure. Lipid and cholesterol levels. These may be checked every 5 years, or more frequently if you are over 70 years old. Skin check. Lung cancer screening. You may have this screening every year starting at age 72 if you have a 30-pack-year history of smoking and currently smoke or have quit within the past 15 years. Fecal occult blood test (FOBT) of the stool. You may have this test every year starting at age 23. Flexible sigmoidoscopy or colonoscopy. You may have a sigmoidoscopy every 5 years or a colonoscopy every 10 years starting at age 55. Prostate cancer screening. Recommendations will vary depending on your family history and other risks. Hepatitis C blood test. Hepatitis B blood test. Sexually transmitted disease (STD) testing. Diabetes screening. This is done by checking your blood sugar (glucose) after you have not eaten for a while (fasting). You may have this done every 1-3 years. Abdominal aortic aneurysm (AAA) screening. You may need this if you are a current or former smoker. Osteoporosis. You  may be screened starting at age 76 if you are at high risk. Talk with your health care provider about your test results, treatment options, and if necessary, the need for more tests. Vaccines  Your health care provider may recommend certain vaccines, such as: Influenza vaccine. This is recommended every year. Tetanus, diphtheria, and acellular pertussis (Tdap, Td)  vaccine. You may need a Td booster every 10 years. Zoster vaccine. You may need this after age 16. Pneumococcal 13-valent conjugate (PCV13) vaccine. One dose is recommended after age 77. Pneumococcal polysaccharide (PPSV23) vaccine. One dose is recommended after age 30. Talk to your health care provider about which screenings and vaccines you need and how often you need them. This information is not intended to replace advice given to you by your health care provider. Make sure you discuss any questions you have with your health care provider. Document Released: 01/02/2016 Document Revised: 08/25/2016 Document Reviewed: 10/07/2015 Elsevier Interactive Patient Education  2017 [place] Prevention in the Home Falls can cause injuries. They can happen to people of all ages. There are many things you can do to make your home safe and to help prevent falls. What can I do on the outside of my home? Regularly fix the edges of walkways and driveways and fix any cracks. Remove anything that might make you trip as you walk through a door, such as a raised step or threshold. Trim any bushes or trees on the path to your home. Use bright outdoor lighting. Clear any walking paths of anything that might make someone trip, such as rocks or tools. Regularly check to see if handrails are loose or broken. Make sure that both sides of any steps have handrails. Any raised decks and porches should have guardrails on the edges. Have any leaves, snow, or ice cleared regularly. Use sand or salt on walking paths during winter. Clean up any spills in your garage right away. This includes oil or grease spills. What can I do in the bathroom? Use night lights. Install grab bars by the toilet and in the tub and shower. Do not use towel bars as grab bars. Use non-skid mats or decals in the tub or shower. If you need to sit down in the shower, use a plastic, non-slip stool. Keep the floor dry. Clean up any  water that spills on the floor as soon as it happens. Remove soap buildup in the tub or shower regularly. Attach bath mats securely with double-sided non-slip rug tape. Do not have throw rugs and other things on the floor that can make you trip. What can I do in the bedroom? Use night lights. Make sure that you have a light by your bed that is easy to reach. Do not use any sheets or blankets that are too big for your bed. They should not hang down onto the floor. Have a firm chair that has side arms. You can use this for support while you get dressed. Do not have throw rugs and other things on the floor that can make you trip. What can I do in the kitchen? Clean up any spills right away. Avoid walking on wet floors. Keep items that you use a lot in easy-to-reach places. If you need to reach something above you, use a strong step stool that has a grab bar. Keep electrical cords out of the way. Do not use floor polish or wax that makes floors slippery. If you must use wax, use non-skid floor wax. Do  not have throw rugs and other things on the floor that can make you trip. What can I do with my stairs? Do not leave any items on the stairs. Make sure that there are handrails on both sides of the stairs and use them. Fix handrails that are broken or loose. Make sure that handrails are as long as the stairways. Check any carpeting to make sure that it is firmly attached to the stairs. Fix any carpet that is loose or worn. Avoid having throw rugs at the top or bottom of the stairs. If you do have throw rugs, attach them to the floor with carpet tape. Make sure that you have a light switch at the top of the stairs and the bottom of the stairs. If you do not have them, ask someone to add them for you. What else can I do to help prevent falls? Wear shoes that: Do not have high heels. Have rubber bottoms. Are comfortable and fit you well. Are closed at the toe. Do not wear sandals. If you use a  stepladder: Make sure that it is fully opened. Do not climb a closed stepladder. Make sure that both sides of the stepladder are locked into place. Ask someone to hold it for you, if possible. Clearly mark and make sure that you can see: Any grab bars or handrails. First and last steps. Where the edge of each step is. Use tools that help you move around (mobility aids) if they are needed. These include: Canes. Walkers. Scooters. Crutches. Turn on the lights when you go into a dark area. Replace any light bulbs as soon as they burn out. Set up your furniture so you have a clear path. Avoid moving your furniture around. If any of your floors are uneven, fix them. If there are any pets around you, be aware of where they are. Review your medicines with your doctor. Some medicines can make you feel dizzy. This can increase your chance of falling. Ask your doctor what other things that you can do to help prevent falls. This information is not intended to replace advice given to you by your health care provider. Make sure you discuss any questions you have with your health care provider. Document Released: 10/02/2009 Document Revised: 05/13/2016 Document Reviewed: 01/10/2015 Elsevier Interactive Patient Education  2017 [employer].

## 2023-02-09 NOTE — Progress Notes (Signed)
I connected with  [name] on 02/09/23 by a audio enabled telemedicine application and verified that I am speaking with the correct person using two identifiers.  Patient Location: Home  Provider Location: Office/Clinic  I discussed the limitations of evaluation and management by telemedicine. The patient expressed understanding and agreed to proceed.  Subjective:   William Richardson. is a 67 y.o. male who presents for Medicare Annual/Subsequent preventive examination.  Review of Systems    Cardiac Risk Factors include: advanced age (>27mn, >>8women);dyslipidemia;male gender;sedentary lifestyle     Objective:    Today's Vitals   02/09/23 1322  Weight: 189 lb (85.7 kg)  Height: 5' 10"$  (1.778 m)   Body mass index is 27.12 kg/m.     02/09/2023    1:31 PM 01/15/2022   10:30 AM 05/09/2021   12:07 PM 01/14/2021   10:55 AM 11/18/2017    8:27 AM  Advanced Directives  Does Patient Have a Medical Advance Directive? Yes Yes No Yes Yes  Type of [place]Corporate [place]f [place] will  [place] will Living will  Does patient want to make changes to medical advance directive?  Yes (MAU/Ambulatory/Procedural Areas - Information given)     Copy of [place]?    No - copy requested No - copy requested    Current Medications (verified) Outpatient Encounter Medications as of 02/09/2023  Medication Sig   fluticasone (FLONASE) 50 MCG/ACT nasal spray Place 2 sprays into both nostrils daily.   meloxicam (MOBIC) 15 MG tablet Take 1 tablet (15 mg total) by mouth as needed.   valACYclovir (VALTREX) 500 MG tablet Take 4 tablets (2,000 mg total) by mouth 2 (two) times daily as needed (x1 day for fever blister).   vitamin B-12 (CYANOCOBALAMIN) 1000 MCG tablet Take 1 tablet (1,000 mcg total) by mouth daily.   azithromycin (ZITHROMAX Z-PAK) 250 MG tablet Take 2 tablets (500 mg) on  Day 1,  followed by 1  tablet (250 mg) once daily on Days 2 through 5. (Patient not taking: Reported on 02/09/2023)   benzonatate (TESSALON) 200 MG capsule Take 1 capsule (200 mg total) by mouth 3 (three) times daily as needed for cough. (Patient not taking: Reported on 02/09/2023)   doxycycline (VIBRA-TABS) 100 MG tablet Take 1 tablet (100 mg total) by mouth 2 (two) times daily. (Patient not taking: Reported on 02/09/2023)   predniSONE (DELTASONE) 20 MG tablet Take 2 tablets (40 mg total) by mouth daily. (Patient not taking: Reported on 02/09/2023)   No facility-administered encounter medications on file as of 02/09/2023.    Allergies (verified) Patient has no known allergies.   History: Past Medical History:  Diagnosis Date   C. difficile diarrhea 06/15/2021   Started after 1 month doxy course. Rx Dificid 10d course 05/2021   Hypogonadism in male    Seasonal allergies    Sleep apnea    Past Surgical History:  Procedure Laterality Date   COLONOSCOPY WITH PROPOFOL N/A 11/18/2017   HP, int hem, rpt 10 yrs ([facility] Males   EYE SURGERY Bilateral    cataract   ROTATOR CUFF REPAIR     SPINE SURGERY     Family History  Problem Relation Age of Onset   Dementia Mother    Breast cancer Mother    Emphysema Father    Diabetes Brother    Social History   Socioeconomic History   Marital status: Married    Spouse name:  [name]   Number of children: 1   Years of education: high school   Highest education level: Not on file  Occupational History   Not on file  Tobacco Use   Smoking status: Never   Smokeless tobacco: Never  Vaping Use   Vaping Use: Never used  Substance and Sexual Activity   Alcohol use: No   Drug use: No   Sexual activity: Yes    Birth control/protection: Post-menopausal  Other Topics Concern   Not on file  Social History Narrative   03/10/20   From: the area   Living: with wife [name] (1981)   Work: former Administrator      Family: Health visitor (Daughter), 3 grandchildren      Enjoys:  vacation, travel      Exercise: not currently, needs to get back into the gym - has a Higher education careers adviser at [employer]   Diet: not great, skips breakfast, eats when hungry       Safety   Seat belts: Yes    Guns: Yes  and secure   Safe in relationships: Yes    Social Determinants of Health   Financial Resource Strain: Low Risk  (02/09/2023)   Overall Financial Resource Strain (CARDIA)    Difficulty of Paying Living Expenses: Not hard at all  Food Insecurity: No Food Insecurity (02/09/2023)   Hunger Vital Sign    Worried About Running Out of Food in the Last Year: Never true    [place] in the Last Year: Never true  Transportation Needs: No Transportation Needs (02/09/2023)   PRAPARE - Hydrologist (Medical): No    Lack of Transportation (Non-Medical): No  Physical Activity: Inactive (02/09/2023)   Exercise Vital Sign    Days of Exercise per Week: 0 days    Minutes of Exercise per Session: 0 min  Stress: No Stress Concern Present (02/09/2023)   [place]    Feeling of Stress : Not at all  Social Connections: [place] (02/09/2023)   Social Connection and Isolation Panel [NHANES]    Frequency of Communication with Friends and Family: More than three times a week    Frequency of Social Gatherings with Friends and Family: More than three times a week    Attends Religious Services: More than 4 times per year    Active Member of [employer] or Organizations: Yes    Attends Music therapist: More than 4 times per year    Marital Status: Married    Tobacco Counseling Counseling given: Not Answered   Clinical Intake:  Pre-visit preparation completed: Yes  Pain : No/denies pain     BMI - recorded: 27.12 Nutritional Status: BMI 25 -29 Overweight Nutritional Risks: None Diabetes: No  How often do you need to have someone help you when you read instructions,  pamphlets, or other written materials from your doctor or pharmacy?: 1 - Never  Diabetic?no  Interpreter Needed?: No  Information entered by :: B.Lew Prout,LPN   Activities of Daily Living    02/09/2023    1:31 PM  In your present state of health, do you have any difficulty performing the following activities:  Hearing? 0  Vision? 0  Difficulty concentrating or making decisions? 0  Walking or climbing stairs? 0  Dressing or bathing? 0  Doing errands, shopping? 0  Preparing Food and eating ? N  Using the Toilet? N  In the past six  months, have you accidently leaked urine? N  Do you have problems with loss of bowel control? N  Managing your Medications? N  Managing your Finances? N  Housekeeping or managing your Housekeeping? N    Patient Care Team: Ria Bush, MD as PCP - General (Family Medicine) Lucilla Lame, MD as Consulting Physician (Gastroenterology)  Indicate any recent Medical Services you may have received from other than [facility] providers in the past year (date may be approximate).     Assessment:   This is a routine wellness examination for Brogan.  Hearing/Vision screen Hearing Screening - Comments:: Adequate hearing Vision Screening - Comments:: Adequate hearing-Dr Marvel Plan  Dietary issues and exercise activities discussed: Current Exercise Habits: The patient does not participate in regular exercise at present, Exercise limited by: None identified   Goals Addressed             This Visit's Progress    Patient Stated   On track    01/14/2021, I will maintain and continue medications as prescribed     Patient Stated   Not on track    Would like to exercise more       Depression Screen    02/09/2023    1:26 PM 01/15/2022   10:32 AM 01/14/2021   10:56 AM 03/10/2020    2:17 PM 01/22/2019    9:15 AM 10/13/2017    3:11 PM 10/07/2016    9:15 AM  PHQ 2/9 Scores  PHQ - 2 Score 0 0 0 0 0 0 0  PHQ- 9 Score   0  0  0    Fall Risk     02/09/2023    1:36 PM 01/15/2022   10:32 AM 01/14/2021   10:55 AM 01/22/2019    9:15 AM 10/13/2017    3:11 PM  Fall Risk   Falls in the past year? 0 0 0 0 No  Number falls in past yr: 0 0 0 0   Injury with Fall? 0 0 0 0   Risk for fall due to : No Fall Risks No Fall Risks No Fall Risks    Follow up Education provided;Falls prevention discussed Falls prevention discussed Falls evaluation completed;Falls prevention discussed      FALL RISK PREVENTION PERTAINING TO THE HOME:  Any stairs in or around the home? Yes  If so, are there any without handrails? Yes  Home free of loose throw rugs in walkways, pet beds, electrical cords, etc? Yes  Adequate lighting in your home to reduce risk of falls? Yes   ASSISTIVE DEVICES UTILIZED TO PREVENT FALLS:  Life alert? No  Use of a cane, walker or w/c? No  Grab bars in the bathroom? No  Shower chair or bench in shower? No  Elevated toilet seat or a handicapped toilet? No    Cognitive Function:    01/14/2021   10:59 AM  MMSE - Mini Mental State Exam  Orientation to time 5  Orientation to Place 5  Registration 3  Attention/ Calculation 5  Recall 3  Language- repeat 1        02/09/2023    1:35 PM  6CIT Screen  What Year? 0 points  What month? 0 points  What time? 0 points  Count back from 20 0 points  Months in reverse 0 points  Repeat phrase 0 points  Total Score 0 points    Immunizations Immunization History  Administered Date(s) Administered   Influenza, High Dose Seasonal PF 11/24/2021   Influenza,inj,Quad PF,6+  Mos 10/01/2015, 10/13/2017, 12/09/2018   Pneumococcal Conjugate-13 03/10/2020   Pneumococcal Polysaccharide-23 12/09/2021   Tdap 10/01/2015   Zoster Recombinat (Shingrix) 08/10/2021   Zoster, Live 01/22/2016    TDAP status: Up to date  Flu Vaccine status: Due, Education has been provided regarding the importance of this vaccine. Advised may receive this vaccine at local pharmacy or Health Dept. Aware to provide  a copy of the vaccination record if obtained from local pharmacy or Health Dept. Verbalized acceptance and understanding.  Pneumococcal vaccine status: Up to date  Covid-19 vaccine status: Declined, Education has been provided regarding the importance of this vaccine but patient still declined. Advised may receive this vaccine at local pharmacy or Health Dept.or vaccine clinic. Aware to provide a copy of the vaccination record if obtained from local pharmacy or Health Dept. Verbalized acceptance and understanding.  Qualifies for Shingles Vaccine? Yes   Zostavax completed Yes   Shingrix Completed?: Yes  Screening Tests Health Maintenance  Topic Date Due   COVID-19 Vaccine (1) Never done   Zoster Vaccines- Shingrix (2 of 2) 10/05/2021   INFLUENZA VACCINE  07/20/2022   Medicare Annual Wellness (AWV)  02/10/2024   DTaP/Tdap/Td (2 - Td or Tdap) 09/30/2025   COLONOSCOPY (Pts 45-10yr Insurance coverage will need to be confirmed)  11/19/2027   Pneumonia Vaccine 67 Years old  Completed   Hepatitis C Screening  Completed   HPV VACCINES  Aged Out    Health Maintenance  Health Maintenance Due  Topic Date Due   COVID-19 Vaccine (1) Never done   Zoster Vaccines- Shingrix (2 of 2) 10/05/2021   INFLUENZA VACCINE  07/20/2022    Colorectal cancer screening: Referral to GI placed yes. Pt aware the office will call re: appt.  Lung Cancer Screening: (Low Dose CT Chest recommended if Age 69-80years, 30 pack-year currently smoking OR have quit w/in 15years.) does not qualify.   Lung Cancer Screening Referral: no  Additional Screening:  Hepatitis C Screening: does not qualify; Completed no  Vision Screening: Recommended annual ophthalmology exams for early detection of glaucoma and other disorders of the eye. Is the patient up to date with their annual eye exam?  Yes  Who is the provider or what is the name of the office in which the patient attends annual eye exams? Dr RMarvel PlanIf pt is  not established with a provider, would they like to be referred to a provider to establish care? No .   Dental Screening: Recommended annual dental exams for proper oral hygiene  Community Resource Referral / Chronic Care Management: CRR required this visit?  No   CCM required this visit?  No      Plan:     I have personally reviewed and noted the following in the patient's chart:   Medical and social history Use of alcohol, tobacco or illicit drugs  Current medications and supplements including opioid prescriptions. Patient is not currently taking opioid prescriptions. Functional ability and status Nutritional status Physical activity Advanced directives List of other physicians Hospitalizations, surgeries, and ER visits in previous 12 months Vitals Screenings to include cognitive, depression, and falls Referrals and appointments  In addition, I have reviewed and discussed with patient certain preventive protocols, quality metrics, and best practice recommendations. A written personalized care plan for preventive services as well as general preventive health recommendations were provided to patient.     BRoger Shelter LPN   [id number]  Nurse Notes: pt has no concerns or questions at this  visit other than desiring colonoscopy. Pt is noted in chart to be due for colonoscopy in 2028 (10-yr interval) Pt indicates he would like to have it at 5 years instead (has had friends pass), wants to assure. Order tee'd for provider signature if approved.

## 2023-02-18 ENCOUNTER — Encounter: Payer: Self-pay | Admitting: *Deleted

## 2023-02-28 ENCOUNTER — Encounter: Payer: Self-pay | Admitting: Family Medicine

## 2023-02-28 ENCOUNTER — Telehealth: Payer: Self-pay | Admitting: Gastroenterology

## 2023-02-28 NOTE — Telephone Encounter (Signed)
Pt left message to schedule colonoscopy please return call  

## 2023-02-28 NOTE — Telephone Encounter (Signed)
I have called patient back and inform that he is not due for a colonoscopy until 2028. Patient stated that his wife had her repeat in 5 years. I tried to let him know that his findings were not cancerous and insurance may not cover.

## 2023-03-01 NOTE — Telephone Encounter (Signed)
to inform you that the polyp removed from your colon was NOT pre-cancerous.   I recommend that you have a repeat colonoscopy exam in 10  years for routine colorectal screening.    Per letter from 2018   Pt is aware as instructed and expressed understanding, nothing further needed at this time  Pt advised if he had changes in bowel habits, abdominal pain, rectal bleeding etc, to be evaluated

## 2023-03-10 DIAGNOSIS — L570 Actinic keratosis: Secondary | ICD-10-CM | POA: Diagnosis not present

## 2023-03-10 DIAGNOSIS — D2261 Melanocytic nevi of right upper limb, including shoulder: Secondary | ICD-10-CM | POA: Diagnosis not present

## 2023-03-10 DIAGNOSIS — Z85828 Personal history of other malignant neoplasm of skin: Secondary | ICD-10-CM | POA: Diagnosis not present

## 2023-03-10 DIAGNOSIS — D2272 Melanocytic nevi of left lower limb, including hip: Secondary | ICD-10-CM | POA: Diagnosis not present

## 2023-03-10 DIAGNOSIS — X32XXXA Exposure to sunlight, initial encounter: Secondary | ICD-10-CM | POA: Diagnosis not present

## 2023-03-10 DIAGNOSIS — D2262 Melanocytic nevi of left upper limb, including shoulder: Secondary | ICD-10-CM | POA: Diagnosis not present

## 2023-03-12 ENCOUNTER — Other Ambulatory Visit: Payer: Self-pay | Admitting: Family Medicine

## 2023-03-12 DIAGNOSIS — E7800 Pure hypercholesterolemia, unspecified: Secondary | ICD-10-CM

## 2023-03-12 DIAGNOSIS — R202 Paresthesia of skin: Secondary | ICD-10-CM

## 2023-03-12 DIAGNOSIS — Z125 Encounter for screening for malignant neoplasm of prostate: Secondary | ICD-10-CM

## 2023-03-14 ENCOUNTER — Other Ambulatory Visit (INDEPENDENT_AMBULATORY_CARE_PROVIDER_SITE_OTHER): Payer: Medicare Other

## 2023-03-14 DIAGNOSIS — Z125 Encounter for screening for malignant neoplasm of prostate: Secondary | ICD-10-CM | POA: Diagnosis not present

## 2023-03-14 DIAGNOSIS — E7800 Pure hypercholesterolemia, unspecified: Secondary | ICD-10-CM

## 2023-03-14 DIAGNOSIS — R202 Paresthesia of skin: Secondary | ICD-10-CM | POA: Diagnosis not present

## 2023-03-14 LAB — COMPREHENSIVE METABOLIC PANEL
ALT: 29 U/L (ref 0–53)
AST: 23 U/L (ref 0–37)
Albumin: 4.5 g/dL (ref 3.5–5.2)
Alkaline Phosphatase: 64 U/L (ref 39–117)
BUN: 14 mg/dL (ref 6–23)
CO2: 29 mEq/L (ref 19–32)
Calcium: 9.5 mg/dL (ref 8.4–10.5)
Chloride: 104 mEq/L (ref 96–112)
Creatinine, Ser: 1.15 mg/dL (ref 0.40–1.50)
GFR: 65.51 mL/min (ref >60.00)
Glucose, Bld: 105 mg/dL — ABNORMAL HIGH (ref 70–99)
Potassium: 4.2 mEq/L (ref 3.5–5.1)
Sodium: 140 mEq/L (ref 135–145)
Total Bilirubin: 0.6 mg/dL (ref 0.2–1.2)
Total Protein: 6.8 g/dL (ref 6.0–8.3)

## 2023-03-14 LAB — LIPID PANEL
Cholesterol: 178 mg/dL (ref 0–200)
HDL: 45.4 mg/dL (ref >39.00)
LDL Cholesterol: 105 mg/dL — ABNORMAL HIGH (ref 0–99)
NonHDL: 132.46
Total CHOL/HDL Ratio: 4
Triglycerides: 138 mg/dL (ref 0.0–149.0)
VLDL: 27.6 mg/dL (ref 0.0–40.0)

## 2023-03-15 LAB — PSA: PSA: 0.53 ng/mL (ref 0.10–4.00)

## 2023-03-15 LAB — VITAMIN B12: Vitamin B-12: 499 pg/mL (ref 211–911)

## 2023-03-17 LAB — VITAMIN B1: Vitamin B1 (Thiamine): 23 nmol/L (ref 8–30)

## 2023-03-23 ENCOUNTER — Ambulatory Visit (INDEPENDENT_AMBULATORY_CARE_PROVIDER_SITE_OTHER): Payer: Medicare Other | Admitting: Family Medicine

## 2023-03-23 ENCOUNTER — Encounter: Payer: Self-pay | Admitting: Family Medicine

## 2023-03-23 VITALS — BP 122/82 | HR 70 | Temp 97.2°F | Ht 69.5 in | Wt 193.0 lb

## 2023-03-23 DIAGNOSIS — Z7189 Other specified counseling: Secondary | ICD-10-CM | POA: Diagnosis not present

## 2023-03-23 DIAGNOSIS — B001 Herpesviral vesicular dermatitis: Secondary | ICD-10-CM

## 2023-03-23 DIAGNOSIS — N529 Male erectile dysfunction, unspecified: Secondary | ICD-10-CM | POA: Insufficient documentation

## 2023-03-23 DIAGNOSIS — G4733 Obstructive sleep apnea (adult) (pediatric): Secondary | ICD-10-CM

## 2023-03-23 DIAGNOSIS — E7800 Pure hypercholesterolemia, unspecified: Secondary | ICD-10-CM | POA: Diagnosis not present

## 2023-03-23 DIAGNOSIS — Z0000 Encounter for general adult medical examination without abnormal findings: Secondary | ICD-10-CM

## 2023-03-23 DIAGNOSIS — R351 Nocturia: Secondary | ICD-10-CM

## 2023-03-23 MED ORDER — TADALAFIL 5 MG PO TABS: 5.0000 mg | ORAL_TABLET | Freq: Every day | ORAL | 3 refills | Status: DC | PRN

## 2023-03-23 MED ORDER — VALACYCLOVIR HCL 1 G PO TABS: 2000.0000 mg | ORAL_TABLET | Freq: Two times a day (BID) | ORAL | 0 refills | Status: DC

## 2023-03-23 MED ORDER — MELOXICAM 15 MG PO TABS: 15.0000 mg | ORAL_TABLET | Freq: Every day | ORAL | 0 refills | Status: DC | PRN

## 2023-03-23 NOTE — Patient Instructions (Addendum)
Trial cialis 5mg  daily for prostate and relations.  Valtrex refilled as well as meloxicam to use as needed.  Good to see you today  Return as needed or in 1 year for next physical.

## 2023-03-23 NOTE — Assessment & Plan Note (Signed)
Asked to bring us copy.  

## 2023-03-23 NOTE — Assessment & Plan Note (Signed)
Preventative protocols reviewed and updated unless pt declined. Discussed healthy diet and lifestyle.  

## 2023-03-23 NOTE — Assessment & Plan Note (Signed)
Anticipate organic vasculogenic ED.  Discussed etiology of organic ED as well as treatment options.  Will trial cialis 5mg  daily dose - to help with BPH symptoms as ewll.  Reviewed side effects and adverse effects to monitor for including but not limited to headache, flushing, chest pain, and priapism. Discussed need to avoid all forms of nitrates while taking this medication.

## 2023-03-23 NOTE — Progress Notes (Signed)
Patient ID: William Richardson., male    DOB: 11-14-1956, 67 y.o.   MRN: [id number]  This visit was conducted in person.  BP 122/82   Pulse 70   Temp (!) 97.2 F (36.2 C) (Temporal)   Ht 5' 9.5" (1.765 m)   Wt 193 lb (87.5 kg)   SpO2 95%   BMI 28.09 kg/m    CC: CPE Subjective:   HPI: William Richardson. is a 67 y.o. male presenting on 03/23/2023 for Annual Exam (MCR prt 2 [AWV- 02/09/23]. Wants to discuss Dr. Darnell Level taking over valacyclovir rx. )   Saw health advisor 01/2023 for medicare wellness visit. Note reviewed.   No results found.  Flowsheet Row Clinical Support from 02/09/2023 in [place] at [facility]  PHQ-2 Total Score 0          02/09/2023    1:36 PM 01/15/2022   10:32 AM 01/14/2021   10:55 AM 01/22/2019    9:15 AM 10/13/2017    3:11 PM  Fall Risk   Falls in the past year? 0 0 0 0 No  Number falls in past yr: 0 0 0 0   Injury with Fall? 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  Notes difficulty with obtaining > maintaining erection, ongoing for 1+ year.   Preventative: Colonoscopy 10/2017 - HP, int hem, rpt 10 yrs Vicente Males) Prostate cancer screening - nocturia x3, yearly PSA. No daytime symptoms. Tamsulosin wasn't effective.  Lung cancer screening - not eligible  Flu shot - tries yearly COVID shot - did not complete  Tdap 2016 Prevnar-13 02/2020, pneumovax 11/2021 RSV - to consider Shingrix - 07/2021, 2nd at [facility] 12/2021  Advanced directive discussion - has this at home. Wife is HCPOA. Asked to bring [place] a copy  Seat belt use discussed Sunscreen use discussed. No changing moles on skin. Sees derm yearly  Smoking - none Alcohol  - none Dentist - q6 mo Eye exam - yearly Bowel - no constipation Bladder -  no  incontinence   From: the area Living: with wife William Richardson (1981) Work: former Administrator Family: Health visitor (Daughter), 3 grandchildren Enjoys: vacation, travel Exercise: not currently, needs to get back into the gym - has a Higher education careers adviser at [employer] Diet: not great, skips breakfast, eats when hungry      Relevant past medical, surgical, family and social history reviewed and updated as indicated. Interim medical history since our last visit reviewed. Allergies and medications reviewed and updated. Outpatient Medications Prior to Visit  Medication Sig Dispense Refill   fluticasone (FLONASE) 50 MCG/ACT nasal spray Place 2 sprays into both nostrils daily. 16 g 6   vitamin B-12 (CYANOCOBALAMIN) 1000 MCG tablet Take 1 tablet (1,000 mcg total) by mouth daily. 90 tablet 1   meloxicam (MOBIC) 15 MG tablet Take 1 tablet (15 mg total) by mouth as needed. 90 tablet 0   valACYclovir (VALTREX) 500 MG tablet Take 4 tablets (2,000 mg total) by mouth 2 (two) times daily as needed (x1 day for fever blister).     No facility-administered medications prior to visit.     Per HPI unless specifically indicated in ROS section below Review of Systems  Constitutional:  Negative  for activity change, appetite change, chills, fatigue, fever and unexpected weight change.  HENT:  Negative for hearing loss.   Eyes:  Negative for visual disturbance.  Respiratory:  Negative for cough, chest tightness, shortness of breath and wheezing.   Cardiovascular:  Negative for chest pain, palpitations and leg swelling.  Gastrointestinal:  Negative for abdominal distention, abdominal pain, blood in stool, constipation, diarrhea, nausea and vomiting.  Genitourinary:  Negative for difficulty urinating and hematuria.  Musculoskeletal:  Negative for arthralgias, myalgias and neck pain.  Skin:  Negative for rash.  Neurological:  Negative for dizziness, seizures, syncope and headaches.  Hematological:  Negative for adenopathy.  Does not bruise/bleed easily.  Psychiatric/Behavioral:  Negative for dysphoric mood. The patient is not nervous/anxious.     Objective:  BP 122/82   Pulse 70   Temp (!) 97.2 F (36.2 C) (Temporal)   Ht 5' 9.5" (1.765 m)   Wt 193 lb (87.5 kg)   SpO2 95%   BMI 28.09 kg/m   Wt Readings from Last 3 Encounters:  03/23/23 193 lb (87.5 kg)  02/09/23 189 lb (85.7 kg)  12/16/22 197 lb (89.4 kg)      Physical Exam Vitals and nursing note reviewed.  Constitutional:      General: He is not in acute distress.    Appearance: Normal appearance. He is well-developed. He is not ill-appearing.  HENT:     Head: Normocephalic and atraumatic.     Right Ear: Hearing, tympanic membrane, ear canal and external ear normal.     Left Ear: Hearing, tympanic membrane, ear canal and external ear normal.     Mouth/Throat:     Mouth: Mucous membranes are moist.     Pharynx: Oropharynx is clear. No oropharyngeal exudate or posterior oropharyngeal erythema.  Eyes:     General: No scleral icterus.    Extraocular Movements: Extraocular movements intact.     Conjunctiva/sclera: Conjunctivae normal.     Pupils: Pupils are equal, round, and reactive to light.  Neck:     Thyroid: No thyroid mass or thyromegaly.     Vascular: No carotid bruit.  Cardiovascular:     Rate and Rhythm: Normal rate and regular rhythm.     Pulses: Normal pulses.          Radial pulses are 2+ on the right side and 2+ on the left side.     Heart sounds: Normal heart sounds. No murmur heard. Pulmonary:     Effort: Pulmonary effort is normal. No respiratory distress.     Breath sounds: Normal breath sounds. No wheezing, rhonchi or rales.  Abdominal:     General: Bowel sounds are normal. There is no distension.     Palpations: Abdomen is soft. There is no mass.     Tenderness: There is no abdominal tenderness. There is no guarding or rebound.     Hernia: No hernia is present.  Musculoskeletal:        General: Normal range of  motion.     Cervical back: Normal range of motion and neck supple.     Right lower leg: No edema.     Left lower leg: No edema.  Lymphadenopathy:     Cervical: No cervical adenopathy.  Skin:    General: Skin is warm and dry.     Findings: No rash.  Neurological:     General: No focal deficit present.     Mental Status: He is alert and oriented to person, place, and time.  Psychiatric:        Mood and Affect: Mood normal.        Behavior: Behavior normal.        Thought Content: Thought content normal.        Judgment: Judgment normal.       Results for orders placed or performed in visit on 03/14/23  Vitamin B1  Result Value Ref Range   Vitamin B1 (Thiamine) 23 8 - 30 nmol/L  Vitamin B12  Result Value Ref Range   Vitamin B-12 499 211 - 911 pg/mL  PSA  Result Value Ref Range   PSA 0.53 0.10 - 4.00 ng/mL  Comprehensive metabolic panel  Result Value Ref Range   Sodium 140 135 - 145 mEq/L   Potassium 4.2 3.5 - 5.1 mEq/L   Chloride 104 96 - 112 mEq/L   CO2 29 19 - 32 mEq/L   Glucose, Bld 105 (H) 70 - 99 mg/dL   BUN 14 6 - 23 mg/dL   Creatinine, Ser 1.15 0.40 - 1.50 mg/dL   Total Bilirubin 0.6 0.2 - 1.2 mg/dL   Alkaline Phosphatase 64 39 - 117 U/L   AST 23 0 - 37 U/L   ALT 29 0 - 53 U/L   Total Protein 6.8 6.0 - 8.3 g/dL   Albumin 4.5 3.5 - 5.2 g/dL   GFR 65.51 >60.00 mL/min   Calcium 9.5 8.4 - 10.5 mg/dL  Lipid panel  Result Value Ref Range   Cholesterol 178 0 - 200 mg/dL   Triglycerides 138.0 0.0 - 149.0 mg/dL   HDL 45.40 >39.00 mg/dL   VLDL 27.6 0.0 - 40.0 mg/dL   LDL Cholesterol 105 (H) 0 - 99 mg/dL   Total CHOL/HDL Ratio 4    NonHDL 132.46     Assessment & Plan:  Continue meloxicam 15mg  QD PRN joint pains - sparing use.  Problem List Items Addressed This Visit     Advanced directives, counseling/discussion (Chronic)    Asked to bring [place] copy       Health maintenance examination - Primary (Chronic)    Preventative protocols reviewed and updated unless  pt declined. Discussed healthy diet and lifestyle.       Herpes labialis    Refilled Valtrex to use PRN       Relevant Medications   valACYclovir (VALTREX) 1000 MG tablet   Hyperlipidemia    Chronic, stable off medicines. Reviewed diet choices to improve LDL levels.  The 10-year ASCVD risk score (Arnett DK, et al., 2019) is: 14.3%   Values used to calculate the score:     Age: 11 years     Sex: Male     Is Non-Hispanic African American: No     Diabetic: No     Tobacco smoker: No     Systolic Blood Pressure: 123XX123 mmHg     Is BP treated: No     HDL Cholesterol: 45.4 mg/dL     Total Cholesterol: 178 mg/dL       Relevant Medications   tadalafil (CIALIS) 5 MG tablet   OSA on CPAP    Continues nightly CPAP at setting of 12, followed by ENT       Nocturia    Ongoing, presumed due to BPH, but not improved with flomax trial. Discussed finasteride - declined. Discussed trial daily cialis - will try this as per below.      Erectile dysfunction    Anticipate organic vasculogenic ED.  Discussed etiology of organic ED as  well as treatment options.  Will trial cialis 5mg  daily dose - to help with BPH symptoms as ewll.  Reviewed side effects and adverse effects to monitor for including but not limited to headache, flushing, chest pain, and priapism. Discussed need to avoid all forms of nitrates while taking this medication.         Meds ordered this encounter  Medications   meloxicam (MOBIC) 15 MG tablet    Sig: Take 1 tablet (15 mg total) by mouth daily as needed for pain.    Dispense:  90 tablet    Refill:  0   valACYclovir (VALTREX) 1000 MG tablet    Sig: Take 2 tablets (2,000 mg total) by mouth 2 (two) times daily. For one day per episode of cold sore as needed    Dispense:  20 tablet    Refill:  0   tadalafil (CIALIS) 5 MG tablet    Sig: Take 1 tablet (5 mg total) by mouth daily as needed for erectile dysfunction.    Dispense:  30 tablet    Refill:  3    No orders of  the defined types were placed in this encounter.   Patient Instructions  Trial cialis 5mg  daily for prostate and relations.  Valtrex refilled as well as meloxicam to use as needed.  Good to see you today  Return as needed or in 1 year for next physical.   Follow up plan: Return in about 1 year (around 03/22/2024) for annual exam, prior fasting for blood work, medicare wellness visit.  Ria Bush, MD

## 2023-03-23 NOTE — Assessment & Plan Note (Signed)
Refilled Valtrex to use PRN

## 2023-03-23 NOTE — Assessment & Plan Note (Signed)
Chronic, stable off medicines. Reviewed diet choices to improve LDL levels.  The 10-year ASCVD risk score (Arnett DK, et al., 2019) is: 14.3%   Values used to calculate the score:     Age: 67 years     Sex: Male     Is Non-Hispanic African American: No     Diabetic: No     Tobacco smoker: No     Systolic Blood Pressure: 123XX123 mmHg     Is BP treated: No     HDL Cholesterol: 45.4 mg/dL     Total Cholesterol: 178 mg/dL

## 2023-03-23 NOTE — Assessment & Plan Note (Addendum)
Continues nightly CPAP at setting of 12, followed by ENT

## 2023-03-23 NOTE — Assessment & Plan Note (Signed)
Ongoing, presumed due to BPH, but not improved with flomax trial. Discussed finasteride - declined. Discussed trial daily cialis - will try this as per below.

## 2023-04-20 ENCOUNTER — Encounter: Payer: Self-pay | Admitting: Family Medicine

## 2023-04-20 DIAGNOSIS — L309 Dermatitis, unspecified: Secondary | ICD-10-CM

## 2023-04-20 MED ORDER — TRIAMCINOLONE ACETONIDE 0.1 % EX CREA: 1.0000 | TOPICAL_CREAM | Freq: Two times a day (BID) | CUTANEOUS | 0 refills | Status: AC

## 2023-04-20 NOTE — Telephone Encounter (Signed)
Kenalog cream Last OV:  03/23/23, CPE Next OV:  03/26/24, CPE

## 2023-08-16 ENCOUNTER — Encounter: Payer: Self-pay | Admitting: Family Medicine

## 2023-08-16 ENCOUNTER — Other Ambulatory Visit: Payer: Self-pay | Admitting: Family Medicine

## 2023-08-16 DIAGNOSIS — N529 Male erectile dysfunction, unspecified: Secondary | ICD-10-CM

## 2023-08-16 MED ORDER — MELOXICAM 15 MG PO TABS: 15.0000 mg | ORAL_TABLET | Freq: Every day | ORAL | 0 refills | Status: DC | PRN

## 2023-08-18 MED ORDER — TADALAFIL 5 MG PO TABS: 5.0000 mg | ORAL_TABLET | Freq: Every day | ORAL | 2 refills | Status: DC | PRN

## 2023-08-18 NOTE — Telephone Encounter (Signed)
E-scribed 90-day rx to [facility]

## 2023-09-30 ENCOUNTER — Telehealth: Payer: Self-pay | Admitting: Family Medicine

## 2023-09-30 MED ORDER — FLUTICASONE PROPIONATE 50 MCG/ACT NA SUSP: 2.0000 | Freq: Every day | NASAL | 6 refills | Status: DC

## 2023-09-30 NOTE — Telephone Encounter (Signed)
Rx sent electronically 

## 2023-09-30 NOTE — Telephone Encounter (Signed)
Prescription Request  09/30/2023  LOV: 03/23/2023  What is the name of the medication or equipment? fluticasone (FLONASE) 50 MCG/ACT nasal spray   Have you contacted your pharmacy to request a refill? No   Which pharmacy would you like this sent to?   [facility] [address] [place], [place] - [address] [place] [address] Phone: [phone] Fax: [phone]    Patient notified that their request is being sent to the clinical staff for review and that they should receive a response within 2 business days.   Please advise at Mobile [phone] (mobile)

## 2023-11-10 DIAGNOSIS — D2262 Melanocytic nevi of left upper limb, including shoulder: Secondary | ICD-10-CM | POA: Diagnosis not present

## 2023-11-10 DIAGNOSIS — D225 Melanocytic nevi of trunk: Secondary | ICD-10-CM | POA: Diagnosis not present

## 2023-11-10 DIAGNOSIS — L570 Actinic keratosis: Secondary | ICD-10-CM | POA: Diagnosis not present

## 2023-11-10 DIAGNOSIS — D485 Neoplasm of uncertain behavior of skin: Secondary | ICD-10-CM | POA: Diagnosis not present

## 2023-11-10 DIAGNOSIS — Z85828 Personal history of other malignant neoplasm of skin: Secondary | ICD-10-CM | POA: Diagnosis not present

## 2023-11-10 DIAGNOSIS — D2261 Melanocytic nevi of right upper limb, including shoulder: Secondary | ICD-10-CM | POA: Diagnosis not present

## 2023-11-29 DIAGNOSIS — D044 Carcinoma in situ of skin of scalp and neck: Secondary | ICD-10-CM | POA: Diagnosis not present

## 2023-12-13 ENCOUNTER — Other Ambulatory Visit: Payer: Self-pay | Admitting: Family Medicine

## 2023-12-15 NOTE — Telephone Encounter (Signed)
Too soon. Rx sent on 09/30/23, #16/6 additional refills to [facility] [place], [place].   Request denied.

## 2024-01-13 ENCOUNTER — Encounter: Payer: Self-pay | Admitting: Family Medicine

## 2024-01-13 MED ORDER — FLUTICASONE PROPIONATE 50 MCG/ACT NA SUSP: 2.0000 | Freq: Every day | NASAL | 2 refills | Status: DC

## 2024-01-13 NOTE — Telephone Encounter (Signed)
E-scribed refill

## 2024-02-15 ENCOUNTER — Ambulatory Visit (INDEPENDENT_AMBULATORY_CARE_PROVIDER_SITE_OTHER): Payer: No Typology Code available for payment source

## 2024-02-15 VITALS — Ht 69.5 in | Wt 193.0 lb

## 2024-02-15 DIAGNOSIS — Z0000 Encounter for general adult medical examination without abnormal findings: Secondary | ICD-10-CM | POA: Diagnosis not present

## 2024-02-15 NOTE — Progress Notes (Signed)
 Subjective:   William Richardson. is a 68 y.o. who presents for a Medicare Wellness preventive visit.  Visit Complete: Virtual I connected with  William Richardson. on 02/15/24 by a audio enabled telemedicine application and verified that I am speaking with the correct person using two identifiers.  Patient Location: Home  Provider Location: Home Office  I discussed the limitations of evaluation and management by telemedicine. The patient expressed understanding and agreed to proceed.  Vital Signs: Because this visit was a virtual/telehealth visit, some criteria may be missing or patient reported. Any vitals not documented were not able to be obtained and vitals that have been documented are patient reported.  VideoDeclined- This patient declined Librarian, academic. Therefore the visit was completed with audio only.  AWV Questionnaire: Yes: Patient Medicare AWV questionnaire was completed by the patient on 02/14/24; I have confirmed that all information answered by patient is correct and no changes since this date.  Cardiac Risk Factors include: advanced age (>19men, >67 women);hypertension;male gender;sedentary lifestyle     Objective:    Today's Vitals   02/15/24 1147  Weight: 193 lb (87.5 kg)  Height: 5' 9.5" (1.765 m)   Body mass index is 28.09 kg/m.     02/15/2024   12:02 PM 02/09/2023    1:31 PM 01/15/2022   10:30 AM 05/09/2021   12:07 PM 01/14/2021   10:55 AM 11/18/2017    8:27 AM  Advanced Directives  Does Patient Have a Medical Advance Directive? Yes Yes Yes No Yes Yes  Type of Estate agent of [place];Living will  Healthcare Power of [place];Living will  Healthcare Power of [place];Living will Living will  Does patient want to make changes to medical advance directive?   Yes (MAU/Ambulatory/Procedural Areas - Information given)     Copy of Healthcare Power of Attorney in Chart? Yes - validated most  recent copy scanned in chart (See row information)    No - copy requested No - copy requested    Current Medications (verified) Outpatient Encounter Medications as of 02/15/2024  Medication Sig   fluticasone (FLONASE) 50 MCG/ACT nasal spray Place 2 sprays into both nostrils daily.   meloxicam (MOBIC) 15 MG tablet Take 1 tablet (15 mg total) by mouth daily as needed for pain.   tadalafil (CIALIS) 5 MG tablet Take 1 tablet (5 mg total) by mouth daily as needed for erectile dysfunction.   triamcinolone cream (KENALOG) 0.1 % Apply 1 Application topically 2 (two) times daily.   valACYclovir (VALTREX) 1000 MG tablet Take 2 tablets (2,000 mg total) by mouth 2 (two) times daily. For one day per episode of cold sore as needed   vitamin B-12 (CYANOCOBALAMIN) 1000 MCG tablet Take 1 tablet (1,000 mcg total) by mouth daily.   No facility-administered encounter medications on file as of 02/15/2024.    Allergies (verified) Patient has no known allergies.   History: Past Medical History:  Diagnosis Date   C. difficile diarrhea 06/15/2021   Started after 1 month doxy course. Rx Dificid 10d course 05/2021   Hypogonadism in male    Seasonal allergies    Sleep apnea    Past Surgical History:  Procedure Laterality Date   COLONOSCOPY WITH PROPOFOL N/A 11/18/2017   HP, int hem, rpt 10 yrs Tobi Bastos)   EYE SURGERY Bilateral    cataract   ROTATOR CUFF REPAIR     SPINE SURGERY     Family History  Problem Relation Age of Onset  Dementia Mother    Breast cancer Mother    Emphysema Father    Diabetes Brother    Social History   Socioeconomic History   Marital status: Married    Spouse name: William Richardson   Number of children: 1   Years of education: high school   Highest education level: Not on file  Occupational History   Not on file  Tobacco Use   Smoking status: Never   Smokeless tobacco: Never  Vaping Use   Vaping status: Never Used  Substance and Sexual Activity   Alcohol use: No   Drug use:  No   Sexual activity: Yes    Birth control/protection: Post-menopausal  Other Topics Concern   Not on file  Social History Narrative   03/10/20   From: the area   Living: with wife William Richardson (1981)   Work: former Naval architect      Family: Therapist, occupational (Daughter), 3 grandchildren      Enjoys: vacation, travel      Exercise: not currently, needs to get back into the gym - has a Research scientist (physical sciences) at [employer]   Diet: not great, skips breakfast, eats when hungry       Safety   Seat belts: Yes    Guns: Yes  and secure   Safe in relationships: Yes    Social Drivers of Health   Financial Resource Strain: Low Risk  (02/15/2024)   Overall Financial Resource Strain (CARDIA)    Difficulty of Paying Living Expenses: Not hard at all  Food Insecurity: No Food Insecurity (02/15/2024)   Hunger Vital Sign    Worried About Running Out of Food in the Last Year: Never true    Ran Out of Food in the Last Year: Never true  Transportation Needs: No Transportation Needs (02/15/2024)   PRAPARE - Administrator, Civil Service (Medical): No    Lack of Transportation (Non-Medical): No  Physical Activity: Inactive (02/15/2024)   Exercise Vital Sign    Days of Exercise per Week: 0 days    Minutes of Exercise per Session: 0 min  Stress: No Stress Concern Present (02/15/2024)   [employer] of Occupational Health - Occupational Stress Questionnaire    Feeling of Stress : Not at all  Social Connections: Moderately Integrated (02/15/2024)   Social Connection and Isolation Panel [NHANES]    Frequency of Communication with Friends and Family: More than three times a week    Frequency of Social Gatherings with Friends and Family: More than three times a week    Attends Religious Services: More than 4 times per year    Active Member of [employer] or Organizations: No    Attends Engineer, structural: Never    Marital Status: Married    Tobacco Counseling Counseling given: Not  Answered  Clinical Intake:  Pre-visit preparation completed: Yes  Pain : No/denies pain   BMI - recorded: 28.09 Nutritional Status: BMI 25 -29 Overweight Nutritional Risks: None Diabetes: No  How often do you need to have someone help you when you read instructions, pamphlets, or other written materials from your doctor or pharmacy?: 1 - Never  Interpreter Needed?: No  Comments: lives with wife Information entered by :: B.Dashiel Bergquist,LPN  Activities of Daily Living     02/14/2024    7:41 PM  In your present state of health, do you have any difficulty performing the following activities:  Hearing? 0  Vision? 0  Difficulty concentrating or making decisions? 0  Walking  or climbing stairs? 0  Dressing or bathing? 0  Doing errands, shopping? 0  Preparing Food and eating ? N  Using the Toilet? N  In the past six months, have you accidently leaked urine? N  Do you have problems with loss of bowel control? N  Managing your Medications? N  Managing your Finances? N  Housekeeping or managing your Housekeeping? N    Patient Care Team: Eustaquio Boyden, MD as PCP - General (Family Medicine) Midge Minium, MD as Consulting Physician (Gastroenterology) Thereasa Solo [facility])  Indicate any recent Medical Services you may have received from other than [facility] providers in the past year (date may be approximate).     Assessment:   This is a routine wellness examination for Chrissie Noa.  Hearing/Vision screen Hearing Screening - Comments:: Pt says his hearing good Vision Screening - Comments:: Pt says his vision is good Dr Senaida Ores   Goals Addressed             This Visit's Progress    Patient Stated   On track    02/15/24- I will maintain and continue medications as prescribed     Patient Stated   Not on track    02/15/24-Would like to exercise more       Depression Screen     02/15/2024   12:38 PM 02/09/2023    1:26 PM 01/15/2022   10:32 AM 01/14/2021   10:56  AM 03/10/2020    2:17 PM 01/22/2019    9:15 AM 10/13/2017    3:11 PM  PHQ 2/9 Scores  PHQ - 2 Score 0 0 0 0 0 0 0  PHQ- 9 Score    0  0     Fall Risk     02/14/2024    7:41 PM 02/09/2023    1:36 PM 01/15/2022   10:32 AM 01/14/2021   10:55 AM 01/22/2019    9:15 AM  Fall Risk   Falls in the past year? 0 0 0 0 0  Number falls in past yr: 0 0 0 0 0  Injury with Fall? 0 0 0 0 0  Risk for fall due to : No Fall Risks No Fall Risks No Fall Risks No Fall Risks   Follow up Education provided;Falls prevention discussed Education provided;Falls prevention discussed Falls prevention discussed Falls evaluation completed;Falls prevention discussed     MEDICARE RISK AT HOME:  Medicare Risk at Home Any stairs in or around the home?: (Patient-Rptd) No If so, are there any without handrails?: (Patient-Rptd) No Home free of loose throw rugs in walkways, pet beds, electrical cords, etc?: (Patient-Rptd) Yes Adequate lighting in your home to reduce risk of falls?: (Patient-Rptd) Yes Life alert?: (Patient-Rptd) No Use of a cane, walker or w/c?: (Patient-Rptd) No Grab bars in the bathroom?: (Patient-Rptd) No Shower chair or bench in shower?: (Patient-Rptd) No Elevated toilet seat or a handicapped toilet?: (Patient-Rptd) No  TIMED UP AND GO:  Was the test performed?  No  Cognitive Function: 6CIT completed    01/14/2021   10:59 AM  MMSE - Mini Mental State Exam  Orientation to time 5  Orientation to Place 5  Registration 3  Attention/ Calculation 5  Recall 3  Language- repeat 1        02/15/2024   12:42 PM 02/09/2023    1:35 PM  6CIT Screen  What Year? 0 points 0 points  What month? 0 points 0 points  What time? 0 points 0 points  Count back from 20  0 points 0 points  Months in reverse 0 points 0 points  Repeat phrase 0 points 0 points  Total Score 0 points 0 points    Immunizations Immunization History  Administered Date(s) Administered   Influenza, High Dose Seasonal PF 11/24/2021    Influenza,inj,Quad PF,6+ Mos 10/01/2015, 10/13/2017, 12/09/2018   Pneumococcal Conjugate-13 03/10/2020   Pneumococcal Polysaccharide-23 12/09/2021   Tdap 10/01/2015   Zoster Recombinant(Shingrix) 08/10/2021   Zoster, Live 01/22/2016    Screening Tests Health Maintenance  Topic Date Due   Zoster Vaccines- Shingrix (2 of 2) 10/05/2021   COVID-19 Vaccine (1 - 2024-25 season) Never done   Medicare Annual Wellness (AWV)  02/14/2025   DTaP/Tdap/Td (2 - Td or Tdap) 09/30/2025   Colonoscopy  11/19/2027   Pneumonia Vaccine 61+ Years old  Completed   INFLUENZA VACCINE  Completed   Hepatitis C Screening  Completed   HPV VACCINES  Aged Out    Health Maintenance  Health Maintenance Due  Topic Date Due   Zoster Vaccines- Shingrix (2 of 2) 10/05/2021   COVID-19 Vaccine (1 - 2024-25 season) Never done   Health Maintenance Items Addressed: none needed   Additional Screening:  Vision Screening: Recommended annual ophthalmology exams for early detection of glaucoma and other disorders of the eye.  Dental Screening: Recommended annual dental exams for proper oral hygiene  Community Resource Referral / Chronic Care Management: CRR required this visit?  No   CCM required this visit?  No    Plan:     I have personally reviewed and noted the following in the patient's chart:   Medical and social history Use of alcohol, tobacco or illicit drugs  Current medications and supplements including opioid prescriptions. Patient is not currently taking opioid prescriptions. Functional ability and status Nutritional status Physical activity Advanced directives List of other physicians Hospitalizations, surgeries, and ER visits in previous 12 months Vitals Screenings to include cognitive, depression, and falls Referrals and appointments  In addition, I have reviewed and discussed with patient certain preventive protocols, quality metrics, and best practice recommendations. A written  personalized care plan for preventive services as well as general preventive health recommendations were provided to patient.    Sue Lush, LPN   1/61/0960   After Visit Summary: (MyChart) Due to this being a telephonic visit, the after visit summary with patients personalized plan was offered to patient via MyChart   Notes: Nothing significant to report at this time.  Pt says he received Shingles vaccine #2 at [facility] on 01/09/2022

## 2024-02-15 NOTE — Patient Instructions (Signed)
 William Richardson , Thank you for taking time to come for your Medicare Wellness Visit. I appreciate your ongoing commitment to your health goals. Please review the following plan we discussed and let me know if I can assist you in the future.   Referrals/Orders/Follow-Ups/Clinician Recommendations: none  This is a list of the screening recommended for you and due dates:  Health Maintenance  Topic Date Due   Zoster (Shingles) Vaccine (2 of 2) 10/05/2021   COVID-19 Vaccine (1 - 2024-25 season) Never done   Medicare Annual Wellness Visit  02/14/2025   DTaP/Tdap/Td vaccine (2 - Td or Tdap) 09/30/2025   Colon Cancer Screening  11/19/2027   Pneumonia Vaccine  Completed   Flu Shot  Completed   Hepatitis C Screening  Completed   HPV Vaccine  Aged Out    Advanced directives: (In Chart) A copy of your advanced directives are scanned into your chart should your provider ever need it.  Next Medicare Annual Wellness Visit scheduled for next year: Yes 02/15/2025 @ 2:20pm televisit

## 2024-03-15 ENCOUNTER — Other Ambulatory Visit: Payer: Self-pay | Admitting: Family Medicine

## 2024-03-15 DIAGNOSIS — Z125 Encounter for screening for malignant neoplasm of prostate: Secondary | ICD-10-CM

## 2024-03-15 DIAGNOSIS — E7800 Pure hypercholesterolemia, unspecified: Secondary | ICD-10-CM

## 2024-03-19 ENCOUNTER — Other Ambulatory Visit (INDEPENDENT_AMBULATORY_CARE_PROVIDER_SITE_OTHER): Payer: Medicare Other

## 2024-03-19 DIAGNOSIS — Z125 Encounter for screening for malignant neoplasm of prostate: Secondary | ICD-10-CM | POA: Diagnosis not present

## 2024-03-19 DIAGNOSIS — E7800 Pure hypercholesterolemia, unspecified: Secondary | ICD-10-CM | POA: Diagnosis not present

## 2024-03-19 LAB — PSA: PSA: 0.62 ng/mL (ref 0.10–4.00)

## 2024-03-19 LAB — LIPID PANEL
Cholesterol: 189 mg/dL (ref 0–200)
HDL: 46 mg/dL (ref >39.00)
LDL Cholesterol: 114 mg/dL — ABNORMAL HIGH (ref 0–99)
NonHDL: 143.48
Total CHOL/HDL Ratio: 4
Triglycerides: 149 mg/dL (ref 0.0–149.0)
VLDL: 29.8 mg/dL (ref 0.0–40.0)

## 2024-03-19 LAB — COMPREHENSIVE METABOLIC PANEL WITH GFR
ALT: 21 U/L (ref 0–53)
AST: 20 U/L (ref 0–37)
Albumin: 4.7 g/dL (ref 3.5–5.2)
Alkaline Phosphatase: 75 U/L (ref 39–117)
BUN: 15 mg/dL (ref 6–23)
CO2: 28 meq/L (ref 19–32)
Calcium: 9.8 mg/dL (ref 8.4–10.5)
Chloride: 105 meq/L (ref 96–112)
Creatinine, Ser: 1.14 mg/dL (ref 0.40–1.50)
GFR: 65.74 mL/min (ref >60.00)
Glucose, Bld: 102 mg/dL — ABNORMAL HIGH (ref 70–99)
Potassium: 4.4 meq/L (ref 3.5–5.1)
Sodium: 142 meq/L (ref 135–145)
Total Bilirubin: 0.7 mg/dL (ref 0.2–1.2)
Total Protein: 7.1 g/dL (ref 6.0–8.3)

## 2024-03-26 ENCOUNTER — Other Ambulatory Visit: Payer: Self-pay | Admitting: Family Medicine

## 2024-03-26 ENCOUNTER — Ambulatory Visit (INDEPENDENT_AMBULATORY_CARE_PROVIDER_SITE_OTHER): Payer: Medicare Other | Admitting: Family Medicine

## 2024-03-26 ENCOUNTER — Encounter: Payer: Self-pay | Admitting: Family Medicine

## 2024-03-26 VITALS — BP 120/82 | HR 60 | Temp 98.0°F | Ht 69.5 in | Wt 193.5 lb

## 2024-03-26 DIAGNOSIS — N529 Male erectile dysfunction, unspecified: Secondary | ICD-10-CM | POA: Diagnosis not present

## 2024-03-26 DIAGNOSIS — G4733 Obstructive sleep apnea (adult) (pediatric): Secondary | ICD-10-CM

## 2024-03-26 DIAGNOSIS — B001 Herpesviral vesicular dermatitis: Secondary | ICD-10-CM

## 2024-03-26 DIAGNOSIS — E7800 Pure hypercholesterolemia, unspecified: Secondary | ICD-10-CM | POA: Diagnosis not present

## 2024-03-26 DIAGNOSIS — Z7189 Other specified counseling: Secondary | ICD-10-CM

## 2024-03-26 DIAGNOSIS — R351 Nocturia: Secondary | ICD-10-CM | POA: Diagnosis not present

## 2024-03-26 DIAGNOSIS — N401 Enlarged prostate with lower urinary tract symptoms: Secondary | ICD-10-CM | POA: Diagnosis not present

## 2024-03-26 DIAGNOSIS — Z0000 Encounter for general adult medical examination without abnormal findings: Secondary | ICD-10-CM

## 2024-03-26 MED ORDER — TADALAFIL 5 MG PO TABS: 5.0000 mg | ORAL_TABLET | Freq: Every day | ORAL | 2 refills | Status: AC

## 2024-03-26 MED ORDER — VALACYCLOVIR HCL 1 G PO TABS: 2000.0000 mg | ORAL_TABLET | Freq: Two times a day (BID) | ORAL | 1 refills | Status: AC

## 2024-03-26 MED ORDER — FLUTICASONE PROPIONATE 50 MCG/ACT NA SUSP: 2.0000 | Freq: Every day | NASAL | 12 refills | Status: DC

## 2024-03-26 NOTE — Assessment & Plan Note (Addendum)
 On nightly CPAP, has seen ENT

## 2024-03-26 NOTE — Assessment & Plan Note (Addendum)
 Previous trial of Flomax didn't help Retrial cialis 5mg  daily.

## 2024-03-26 NOTE — Assessment & Plan Note (Addendum)
 Chronic off medication. Reviewed diet choices to improve cholesterol control. Consider cardiac CT with calcium score for further risk stratification The 10-year ASCVD risk score (Arnett DK, et al., 2019) is: 15.4%   Values used to calculate the score:     Age: 68 years     Sex: Male     Is Non-Hispanic African American: No     Diabetic: No     Tobacco smoker: No     Systolic Blood Pressure: 120 mmHg     Is BP treated: No     HDL Cholesterol: 46 mg/dL     Total Cholesterol: 189 mg/dL

## 2024-03-26 NOTE — Progress Notes (Signed)
 Ph: [phone] Fax: [phone]   Patient ID: William Bologna., male    DOB: 12-20-1956, 68 y.o.   MRN: [id number]  This visit was conducted in person.  BP 120/82   Pulse 60   Temp 98 F (36.7 C) (Oral)   Ht 5' 9.5" (1.765 m)   Wt 193 lb 8 oz (87.8 kg)   SpO2 96%   BMI 28.17 kg/m    CC: CPE Subjective:   HPI: William Richardson. is a 68 y.o. male presenting on 03/26/2024 for Annual Exam (MCR prt 2 [AWV- 02/15/24].)   Saw health advisor 01/2025 for medicare wellness visit. Note reviewed.   No results found.  Flowsheet Row Office Visit from 03/26/2024 in [facility] [place]  PHQ-2 Total Score 0          03/26/2024   10:59 AM 02/14/2024    7:41 PM 02/09/2023    1:36 PM 01/15/2022   10:32 AM 01/14/2021   10:55 AM  Fall Risk   Falls in the past year? 0 0 0 0 0  Number falls in past yr:  0 0 0 0  Injury with Fall?  0 0 0 0  Risk for fall due to :  No Fall Risks No Fall Risks No Fall Risks No Fall Risks  Follow up  Education provided;Falls prevention discussed Education provided;Falls prevention discussed Falls prevention discussed Falls evaluation completed;Falls prevention discussed    ED / BPH - managed with cialis 5mg  daily with limited benefit.   Continues meloxicam PRN with benefit.   Preventative: Colonoscopy 10/2017 - [facility], int hem, rpt 10 yrs Tobi Bastos) Prostate cancer screening - yearly PSA. Nocturia x1-2. No daytime symptoms. Flomax didn't help.  Lung cancer screening - not eligible  Flu shot - tries yearly COVID shot - did not complete  Tdap 2016 Prevnar-13 02/2020, pneumovax 11/2021 Shingrix - 07/2021 in office, 2nd at [facility] 12/2021  Advanced directive discussion - scanned and in chart 2022. Wife Kerry Fort is HCPOA followed by Delila Spence and Spero Geralds. Full code but would not desire prolonged life support if terminal condition.  Seat belt use discussed Sunscreen use discussed. No changing moles on skin. Sees derm yearly   Sleep -averaging 7-8 hours/night  Smoking - none Alcohol  - none Dentist - q6 mo Eye exam - yearly Bowel - no constipation Bladder - no incontinence   From: the area Living: with wife [name] (1981) Work: former Naval architect Family: Therapist, occupational (Daughter), 3 grandchildren Enjoys: vacation, travel Exercise: not currently, needs to get back into the gym - has a Research scientist (physical sciences) at [employer] Diet: not great, skips breakfast, eats when hungry      Relevant past medical, surgical, family and social history reviewed and updated as indicated. Interim medical history since our last visit reviewed. Allergies and medications reviewed and updated. Outpatient Medications Prior to Visit  Medication Sig Dispense Refill   Ascorbic Acid (VITAMIN C PO) Take by mouth.     B Complex-C (SUPER B COMPLEX PO) Take by mouth.     ELDERBERRY-VITAMIN C-ZINC PO Take by mouth. +vitamin D     Glucosamine HCl (GLUCOSAMINE PO) Take by mouth.     MAGNESIUM PO Take by mouth.     meloxicam (MOBIC) 15 MG tablet Take 1 tablet (15 mg total) by mouth daily as needed for pain. 90 tablet 0   Multiple Vitamins-Minerals (CENTRUM PO) Take by mouth.     Omega-3 Fatty Acids (OMEGA-3  FISH OIL PO) Take by mouth.     Probiotic Product (PROBIOTIC PO) Take by mouth.     triamcinolone cream (KENALOG) 0.1 % Apply 1 Application topically 2 (two) times daily. 30 g 0   TURMERIC PO Take by mouth.     vitamin B-12 (CYANOCOBALAMIN) 1000 MCG tablet Take 1 tablet (1,000 mcg total) by mouth daily. 90 tablet 1   VITAMIN E PO Take by mouth.     fluticasone (FLONASE) 50 MCG/ACT nasal spray Place 2 sprays into both nostrils daily. 16 g 2   tadalafil (CIALIS) 5 MG tablet Take 1 tablet (5 mg total) by mouth daily as needed for erectile dysfunction. 90 tablet 2   valACYclovir (VALTREX) 1000 MG tablet Take 2 tablets (2,000 mg total) by mouth 2 (two) times daily. For one day per episode of cold sore as needed 20 tablet 0   No facility-administered  medications prior to visit.     Per HPI unless specifically indicated in ROS section below Review of Systems  Constitutional:  Negative for activity change, appetite change, chills, fatigue, fever and unexpected weight change.  HENT:  Negative for hearing loss.   Eyes:  Negative for visual disturbance.  Respiratory:  Negative for cough, chest tightness, shortness of breath and wheezing.   Cardiovascular:  Negative for chest pain, palpitations and leg swelling.  Gastrointestinal:  Negative for abdominal distention, abdominal pain, blood in stool, constipation, diarrhea, nausea and vomiting.  Genitourinary:  Negative for difficulty urinating and hematuria.  Musculoskeletal:  Negative for arthralgias, myalgias and neck pain.  Skin:  Negative for rash.  Neurological:  Positive for headaches (occ left sided). Negative for dizziness, seizures and syncope.  Hematological:  Negative for adenopathy. Does not bruise/bleed easily.  Psychiatric/Behavioral:  Negative for dysphoric mood. The patient is not nervous/anxious.     Objective:  BP 120/82   Pulse 60   Temp 98 F (36.7 C) (Oral)   Ht 5' 9.5" (1.765 m)   Wt 193 lb 8 oz (87.8 kg)   SpO2 96%   BMI 28.17 kg/m   Wt Readings from Last 3 Encounters:  03/26/24 193 lb 8 oz (87.8 kg)  02/15/24 193 lb (87.5 kg)  03/23/23 193 lb (87.5 kg)      Physical Exam Vitals and nursing note reviewed.  Constitutional:      General: He is not in acute distress.    Appearance: Normal appearance. He is well-developed. He is not ill-appearing.  HENT:     Head: Normocephalic and atraumatic.     Right Ear: Hearing, tympanic membrane, ear canal and external ear normal.     Left Ear: Hearing, tympanic membrane, ear canal and external ear normal.     Mouth/Throat:     Mouth: Mucous membranes are moist.     Pharynx: Oropharynx is clear. No oropharyngeal exudate or posterior oropharyngeal erythema.  Eyes:     General: No scleral icterus.    Extraocular  Movements: Extraocular movements intact.     Conjunctiva/sclera: Conjunctivae normal.     Pupils: Pupils are equal, round, and reactive to light.  Neck:     Thyroid: No thyroid mass or thyromegaly.     Vascular: No carotid bruit.  Cardiovascular:     Rate and Rhythm: Normal rate and regular rhythm.     Pulses: Normal pulses.          Radial pulses are 2+ on the right side and 2+ on the left side.     Heart sounds:  Normal heart sounds. No murmur heard. Pulmonary:     Effort: Pulmonary effort is normal. No respiratory distress.     Breath sounds: Normal breath sounds. No wheezing, rhonchi or rales.  Abdominal:     General: Bowel sounds are normal. There is no distension.     Palpations: Abdomen is soft. There is no mass.     Tenderness: There is no abdominal tenderness. There is no guarding or rebound.     Hernia: No hernia is present.  Musculoskeletal:        General: Normal range of motion.     Cervical back: Normal range of motion and neck supple.     Right lower leg: No edema.     Left lower leg: No edema.  Lymphadenopathy:     Cervical: No cervical adenopathy.  Skin:    General: Skin is warm and dry.     Findings: No rash.  Neurological:     General: No focal deficit present.     Mental Status: He is alert and oriented to person, place, and time.  Psychiatric:        Mood and Affect: Mood normal.        Behavior: Behavior normal.        Thought Content: Thought content normal.        Judgment: Judgment normal.       Results for orders placed or performed in visit on 03/19/24  PSA   Collection Time: 03/19/24  8:50 AM  Result Value Ref Range   PSA 0.62 0.10 - 4.00 ng/mL  Comprehensive metabolic panel with GFR   Collection Time: 03/19/24  8:50 AM  Result Value Ref Range   Sodium 142 135 - 145 mEq/L   Potassium 4.4 3.5 - 5.1 mEq/L   Chloride 105 96 - 112 mEq/L   CO2 28 19 - 32 mEq/L   Glucose, Bld 102 (H) 70 - 99 mg/dL   BUN 15 6 - 23 mg/dL   Creatinine, Ser 0.98  0.40 - 1.50 mg/dL   Total Bilirubin 0.7 0.2 - 1.2 mg/dL   Alkaline Phosphatase 75 39 - 117 U/L   AST 20 0 - 37 U/L   ALT 21 0 - 53 U/L   Total Protein 7.1 6.0 - 8.3 g/dL   Albumin 4.7 3.5 - 5.2 g/dL   GFR 11.91 >47.82 mL/min   Calcium 9.8 8.4 - 10.5 mg/dL  Lipid panel   Collection Time: 03/19/24  8:50 AM  Result Value Ref Range   Cholesterol 189 0 - 200 mg/dL   Triglycerides 956.2 0.0 - 149.0 mg/dL   HDL 13.08 >65.78 mg/dL   VLDL 46.9 0.0 - 62.9 mg/dL   LDL Cholesterol 528 (H) 0 - 99 mg/dL   Total CHOL/HDL Ratio 4    NonHDL 143.48    Lab Results  Component Value Date   VITAMINB12 499 03/14/2023    Assessment & Plan:   Problem List Items Addressed This Visit     Advanced directives, counseling/discussion (Chronic)   Advanced directive discussion - scanned and in chart 2022. Wife Kerry Fort is HCPOA followed by Delila Spence and Spero Geralds. Full code but would not desire prolonged life support if terminal condition.       Health maintenance examination - Primary (Chronic)   Preventative protocols reviewed and updated unless pt declined. Discussed healthy diet and lifestyle.       Herpes labialis   Continue valtrex PRN flares      Relevant Medications  valACYclovir (VALTREX) 1000 MG tablet   Hyperlipidemia   Chronic off medication. Reviewed diet choices to improve cholesterol control. Consider cardiac CT with calcium score for further risk stratification The 10-year ASCVD risk score (Arnett DK, et al., 2019) is: 15.4%   Values used to calculate the score:     Age: 41 years     Sex: Male     Is Non-Hispanic African American: No     Diabetic: No     Tobacco smoker: No     Systolic Blood Pressure: 120 mmHg     Is BP treated: No     HDL Cholesterol: 46 mg/dL     Total Cholesterol: 189 mg/dL       Relevant Medications   tadalafil (CIALIS) 5 MG tablet   OSA on CPAP   On nightly CPAP, has seen ENT      BPH associated with nocturia   Previous trial of Flomax  didn't help Retrial cialis 5mg  daily.       Erectile dysfunction   Discussed cialis 5mg  daily use - may continue this.       Relevant Medications   tadalafil (CIALIS) 5 MG tablet     Meds ordered this encounter  Medications   fluticasone (FLONASE) 50 MCG/ACT nasal spray    Sig: Place 2 sprays into both nostrils daily.    Dispense:  16 g    Refill:  12   tadalafil (CIALIS) 5 MG tablet    Sig: Take 1 tablet (5 mg total) by mouth daily.    Dispense:  90 tablet    Refill:  2    For ED and BPH   valACYclovir (VALTREX) 1000 MG tablet    Sig: Take 2 tablets (2,000 mg total) by mouth 2 (two) times daily. For one day per episode of cold sore as needed    Dispense:  16 tablet    Refill:  1    No orders of the defined types were placed in this encounter.   Patient Instructions  Cialis and valtrex refilled Good to see you today  Return as needed or in 1 year for next physical   Follow up plan: Return in about 1 year (around 03/26/2025) for annual exam, prior fasting for blood work.  Eustaquio Boyden, MD

## 2024-03-26 NOTE — Assessment & Plan Note (Signed)
 Preventative protocols reviewed and updated unless pt declined. Discussed healthy diet and lifestyle.

## 2024-03-26 NOTE — Assessment & Plan Note (Signed)
 Continue valtrex PRN flares

## 2024-03-26 NOTE — Assessment & Plan Note (Signed)
 Discussed cialis 5mg  daily use - may continue this.

## 2024-03-26 NOTE — Patient Instructions (Addendum)
 Cialis and valtrex refilled Good to see you today  Return as needed or in 1 year for next physical

## 2024-03-26 NOTE — Assessment & Plan Note (Addendum)
 Advanced directive discussion - scanned and in chart 2022. Wife Kerry Fort is HCPOA followed by Delila Spence and Spero Geralds. Full code but would not desire prolonged life support if terminal condition.

## 2024-03-27 NOTE — Telephone Encounter (Signed)
 Rx already filled in another message

## 2024-05-10 DIAGNOSIS — L570 Actinic keratosis: Secondary | ICD-10-CM | POA: Diagnosis not present

## 2024-05-10 DIAGNOSIS — D2271 Melanocytic nevi of right lower limb, including hip: Secondary | ICD-10-CM | POA: Diagnosis not present

## 2024-05-10 DIAGNOSIS — D2261 Melanocytic nevi of right upper limb, including shoulder: Secondary | ICD-10-CM | POA: Diagnosis not present

## 2024-05-10 DIAGNOSIS — D225 Melanocytic nevi of trunk: Secondary | ICD-10-CM | POA: Diagnosis not present

## 2024-05-10 DIAGNOSIS — D2262 Melanocytic nevi of left upper limb, including shoulder: Secondary | ICD-10-CM | POA: Diagnosis not present

## 2024-05-10 DIAGNOSIS — Z85828 Personal history of other malignant neoplasm of skin: Secondary | ICD-10-CM | POA: Diagnosis not present

## 2024-05-10 DIAGNOSIS — L821 Other seborrheic keratosis: Secondary | ICD-10-CM | POA: Diagnosis not present

## 2024-05-10 DIAGNOSIS — Z08 Encounter for follow-up examination after completed treatment for malignant neoplasm: Secondary | ICD-10-CM | POA: Diagnosis not present

## 2024-05-10 DIAGNOSIS — D2272 Melanocytic nevi of left lower limb, including hip: Secondary | ICD-10-CM | POA: Diagnosis not present

## 2024-08-08 ENCOUNTER — Other Ambulatory Visit: Payer: Self-pay | Admitting: Family Medicine

## 2024-08-08 DIAGNOSIS — G8929 Other chronic pain: Secondary | ICD-10-CM

## 2024-08-09 NOTE — Telephone Encounter (Signed)
 Meloxicam  Last filled:  08/16/23, #90 Last OV:  03/26/24, annual exam Next OV:  04/01/25, annual exam

## 2024-08-13 DIAGNOSIS — H903 Sensorineural hearing loss, bilateral: Secondary | ICD-10-CM | POA: Diagnosis not present

## 2024-08-13 DIAGNOSIS — H9313 Tinnitus, bilateral: Secondary | ICD-10-CM | POA: Diagnosis not present

## 2024-08-13 DIAGNOSIS — G4733 Obstructive sleep apnea (adult) (pediatric): Secondary | ICD-10-CM | POA: Diagnosis not present

## 2024-10-14 NOTE — Patient Instructions (Signed)
 Healthy Eating, Adult Healthy eating may help you get and keep a healthy body weight, reduce the risk of chronic disease, and live a long and productive life. It is important to follow a healthy eating pattern. Your nutritional and calorie needs should be met mainly by different nutrient-rich foods. What are tips for following this plan? Reading food labels Read labels and choose the following: Reduced or low sodium products. Juices with 100% fruit juice. Foods with low saturated fats (<3 g per serving) and high polyunsaturated and monounsaturated fats. Foods with whole grains, such as whole wheat, cracked wheat, brown rice, and wild rice. Whole grains that are fortified with folic acid . This is recommended for females who are pregnant or who want to become pregnant. Read labels and do not eat or drink the following: Foods or drinks with added sugars. These include foods that contain brown sugar, corn sweetener, corn syrup, dextrose , fructose, glucose, high-fructose corn syrup, honey, invert sugar, lactose, malt syrup, maltose, molasses, raw sugar, sucrose, trehalose, or turbinado sugar. Limit your intake of added sugars to less than 10% of your total daily calories. Do not eat more than the following amounts of added sugar per day: 6 teaspoons (25 g) for females. 9 teaspoons (38 g) for males. Foods that contain processed or refined starches and grains. Refined grain products, such as white flour, degermed cornmeal, white bread, and white rice. Shopping Choose nutrient-rich snacks, such as vegetables, whole fruits, and nuts. Avoid high-calorie and high-sugar snacks, such as potato chips, fruit snacks, and candy. Use oil-based dressings and spreads on foods instead of solid fats such as butter, margarine, sour cream, or cream cheese. Limit pre-made sauces, mixes, and instant products such as flavored rice, instant noodles, and ready-made pasta. Try more plant-protein sources, such as tofu,  tempeh, black beans, edamame, lentils, nuts, and seeds. Explore eating plans such as the Mediterranean diet or vegetarian diet. Try heart-healthy dips made with beans and healthy fats like hummus and guacamole. Vegetables go great with these. Cooking Use oil to saut or stir-fry foods instead of solid fats such as butter, margarine, or lard. Try baking, boiling, grilling, or broiling instead of frying. Remove the fatty part of meats before cooking. Steam vegetables in water  or broth. Meal planning  At meals, imagine dividing your plate into fourths: One-half of your plate is fruits and vegetables. One-fourth of your plate is whole grains. One-fourth of your plate is protein, especially lean meats, poultry, eggs, tofu, beans, or nuts. Include low-fat dairy as part of your daily diet. Lifestyle Choose healthy options in all settings, including home, work, school, restaurants, or stores. Prepare your food safely: Wash your hands after handling raw meats. Where you prepare food, keep surfaces clean by regularly washing with hot, soapy water . Keep raw meats separate from ready-to-eat foods, such as fruits and vegetables. Cook seafood, meat, poultry, and eggs to the recommended temperature. Get a food thermometer. Store foods at safe temperatures. In general: Keep cold foods at 34F (4.4C) or below. Keep hot foods at 134F (60C) or above. Keep your freezer at [facility] (-17.8C) or below. Foods are not safe to eat if they have been between the temperatures of 40-134F (4.4-60C) for more than 2 hours. What foods should I eat? Fruits Aim to eat 1-2 cups of fresh, canned (in natural juice), or frozen fruits each day. One cup of fruit equals 1 small apple, 1 large banana, 8 large strawberries, 1 cup (237 g) canned fruit,  cup (82 g) dried fruit,  or 1 cup (240 mL) 100% juice. Vegetables Aim to eat 2-4 cups of fresh and frozen vegetables each day, including different varieties and colors. One cup  of vegetables equals 1 cup (91 g) broccoli or cauliflower florets, 2 medium carrots, 2 cups (150 g) raw, leafy greens, 1 large tomato, 1 large bell pepper, 1 large sweet potato, or 1 medium white potato. Grains Aim to eat 5-10 ounce-equivalents of whole grains each day. Examples of 1 ounce-equivalent of grains include 1 slice of bread, 1 cup (40 g) ready-to-eat cereal, 3 cups (24 g) popcorn, or  cup (93 g) cooked rice. Meats and other proteins Try to eat 5-7 ounce-equivalents of protein each day. Examples of 1 ounce-equivalent of protein include 1 egg,  oz nuts (12 almonds, 24 pistachios, or 7 walnut halves), 1/4 cup (90 g) cooked beans, 6 tablespoons (90 g) hummus or 1 tablespoon (16 g) peanut butter. A cut of meat or fish that is the size of a deck of cards is about 3-4 ounce-equivalents (85 g). Of the protein you eat each week, try to have at least 8 sounce (227 g) of seafood. This is about 2 servings per week. This includes salmon, trout, herring, sardines, and anchovies. Dairy Aim to eat 3 cup-equivalents of fat-free or low-fat dairy each day. Examples of 1 cup-equivalent of dairy include 1 cup (240 mL) milk, 8 ounces (250 g) yogurt, 1 ounces (44 g) natural cheese, or 1 cup (240 mL) fortified soy milk. Fats and oils Aim for about 5 teaspoons (21 g) of fats and oils per day. Choose monounsaturated fats, such as canola and olive oils, mayonnaise made with olive oil or avocado oil, avocados, peanut butter, and most nuts, or polyunsaturated fats, such as sunflower, corn, and soybean oils, walnuts, pine nuts, sesame seeds, sunflower seeds, and flaxseed. Beverages Aim for 6 eight-ounce glasses of water  per day. Limit coffee to 3-5 eight-ounce cups per day. Limit caffeinated beverages that have added calories, such as soda and energy drinks. If you drink alcohol: Limit how much you have to: 0-1 drink a day if you are male. 0-2 drinks a day if you are male. Know how much alcohol is in your drink.  In the [place] one drink is one 12 oz bottle of beer (355 mL), one 5 oz glass of wine (148 mL), or one 1 oz glass of hard liquor (44 mL). Seasoning and other foods Try not to add too much salt to your food. Try using herbs and spices instead of salt. Try not to add sugar to food. This information is based on [place] nutrition guidelines. To learn more, visit [employer]. Exact amounts may vary. You may need different amounts. This information is not intended to replace advice given to you by your health care provider. Make sure you discuss any questions you have with your health care provider. Document Revised: 09/06/2022 Document Reviewed: 09/06/2022 Elsevier Patient Education  2024 [employer].

## 2024-10-15 ENCOUNTER — Encounter: Admitting: Nurse Practitioner

## 2024-10-15 ENCOUNTER — Ambulatory Visit (INDEPENDENT_AMBULATORY_CARE_PROVIDER_SITE_OTHER): Payer: Self-pay | Admitting: Nurse Practitioner

## 2024-10-15 ENCOUNTER — Encounter: Payer: Self-pay | Admitting: Nurse Practitioner

## 2024-10-15 VITALS — BP 127/82 | HR 63 | Temp 97.9°F | Resp 16 | Ht 69.49 in | Wt 190.0 lb

## 2024-10-15 DIAGNOSIS — Z0289 Encounter for other administrative examinations: Secondary | ICD-10-CM | POA: Insufficient documentation

## 2024-10-15 LAB — POCT URINALYSIS DIPSTICK
Bilirubin, UA: NEGATIVE
Blood, UA: NEGATIVE
Color, UA: NORMAL
Glucose, UA: NEGATIVE
Ketones, UA: NEGATIVE
Leukocytes, UA: NEGATIVE
Nitrite, UA: NEGATIVE
Odor: NORMAL
Protein, UA: NEGATIVE
Spec Grav, UA: 1.01 (ref 1.010–1.025)
Urobilinogen, UA: 1 U/dL
pH, UA: 7.5 (ref 5.0–8.0)

## 2024-10-15 NOTE — Progress Notes (Signed)
 BP 127/82 (BP Location: Left Arm, Patient Position: Sitting, Cuff Size: Large)   Pulse 63   Temp 97.9 F (36.6 C) (Oral)   Resp 16   Ht 5' 9.49 (1.765 m)   Wt 190 lb (86.2 kg)   SpO2 98%   BMI 27.67 kg/m    Subjective:    Patient ID: William Reeves Shlomo Mickey., male    DOB: 1956/03/21, 68 y.o.   MRN: [id number]  HPI: William Bruschi. is a 68 y.o. male presenting on 10/15/2024 for DOT Physical.  Previous DOT physical was 1-2 years ago.  Currently patient [place] in state majority, may have occasional out of state.    Past Medical History:  Past Medical History:  Diagnosis Date   C. difficile diarrhea 06/15/2021   Started after 1 month doxy course. Rx Dificid  10d course 05/2021   Hypogonadism in male    Seasonal allergies    Sleep apnea     Surgical History:  Past Surgical History:  Procedure Laterality Date   COLONOSCOPY WITH PROPOFOL  N/A 11/18/2017   HP, int hem, rpt 10 yrs Romero)   EYE SURGERY Bilateral    cataract   ROTATOR CUFF REPAIR     SPINE SURGERY      Medications:  Current Outpatient Medications on File Prior to Visit  Medication Sig   Ascorbic Acid (VITAMIN C PO) Take by mouth.   B Complex-C (SUPER B COMPLEX PO) Take by mouth.   ELDERBERRY-VITAMIN C-ZINC PO Take by mouth. +vitamin D   fluticasone  (FLONASE ) 50 MCG/ACT nasal spray Place 2 sprays into both nostrils daily.   Glucosamine HCl (GLUCOSAMINE PO) Take by mouth.   MAGNESIUM PO Take by mouth.   meloxicam  (MOBIC ) 15 MG tablet TAKE 1 TABLET BY MOUTH ONCE DAILY AS NEEDED FOR PAIN   Multiple Vitamins-Minerals (CENTRUM PO) Take by mouth.   Omega-3 Fatty Acids (OMEGA-3 FISH OIL PO) Take by mouth.   Probiotic Product (PROBIOTIC PO) Take by mouth.   tadalafil  (CIALIS ) 5 MG tablet Take 1 tablet (5 mg total) by mouth daily.   triamcinolone  cream (KENALOG ) 0.1 % Apply 1 Application topically 2 (two) times daily.   TURMERIC PO Take by mouth.   valACYclovir  (VALTREX ) 1000 MG tablet Take 2 tablets  (2,000 mg total) by mouth 2 (two) times daily. For one day per episode of cold sore as needed   vitamin B-12 (CYANOCOBALAMIN ) 1000 MCG tablet Take 1 tablet (1,000 mcg total) by mouth daily.   VITAMIN E PO Take by mouth.   No current facility-administered medications on file prior to visit.    Allergies:  No Known Allergies  Social History:  Social History   Socioeconomic History   Marital status: Married    Spouse name: [name]   Number of children: 1   Years of education: high school   Highest education level: Not on file  Occupational History   Not on file  Tobacco Use   Smoking status: Never   Smokeless tobacco: Never  Vaping Use   Vaping status: Never Used  Substance and Sexual Activity   Alcohol use: No   Drug use: No   Sexual activity: Yes    Birth control/protection: Post-menopausal  Other Topics Concern   Not on file  Social History Narrative   03/10/20   From: the area   Living: with wife [name] (1981)   Work: former naval architect      Family: Elouise Blush (Daughter), 3 grandchildren  Enjoys: vacation, travel      Exercise: not currently, needs to get back into the gym - has a membership at [employer]   Diet: not great, skips breakfast, eats when hungry       Safety   Seat belts: Yes    Guns: Yes  and secure   Safe in relationships: Yes    Social Drivers of Health   Financial Resource Strain: Low Risk  (02/15/2024)   Overall Financial Resource Strain (CARDIA)    Difficulty of Paying Living Expenses: Not hard at all  Food Insecurity: No Food Insecurity (02/15/2024)   Hunger Vital Sign    Worried About Running Out of Food in the Last Year: Never true    Ran Out of Food in the Last Year: Never true  Transportation Needs: No Transportation Needs (02/15/2024)   PRAPARE - Administrator, Civil Service (Medical): No    Lack of Transportation (Non-Medical): No  Physical Activity: Inactive (02/15/2024)   Exercise Vital Sign    Days of  Exercise per Week: 0 days    Minutes of Exercise per Session: 0 min  Stress: No Stress Concern Present (02/15/2024)   [employer] of Occupational Health - Occupational Stress Questionnaire    Feeling of Stress : Not at all  Social Connections: Moderately Integrated (02/15/2024)   Social Connection and Isolation Panel    Frequency of Communication with Friends and Family: More than three times a week    Frequency of Social Gatherings with Friends and Family: More than three times a week    Attends Religious Services: More than 4 times per year    Active Member of [employer] or Organizations: No    Attends Banker Meetings: Never    Marital Status: Married  Catering Manager Violence: Not At Risk (02/15/2024)   Humiliation, Afraid, Rape, and Kick questionnaire    Fear of Current or Ex-Partner: No    Emotionally Abused: No    Physically Abused: No    Sexually Abused: No   Social History   Tobacco Use  Smoking Status Never  Smokeless Tobacco Never   Social History   Substance and Sexual Activity  Alcohol Use No    Family History:  Family History  Problem Relation Age of Onset   Dementia Mother    Breast cancer Mother    Emphysema Father    Diabetes Brother    Past medical history, surgical history, medications, allergies, family history and social history reviewed with patient today and changes made to appropriate areas of the chart.   ROS All other ROS negative except what is listed above and in the HPI.      Objective:    BP 127/82 (BP Location: Left Arm, Patient Position: Sitting, Cuff Size: Large)   Pulse 63   Temp 97.9 F (36.6 C) (Oral)   Resp 16   Ht 5' 9.49 (1.765 m)   Wt 190 lb (86.2 kg)   SpO2 98%   BMI 27.67 kg/m   Wt Readings from Last 3 Encounters:  10/15/24 190 lb (86.2 kg)  03/26/24 193 lb 8 oz (87.8 kg)  02/15/24 193 lb (87.5 kg)    Physical Exam Vitals and nursing note reviewed.  Constitutional:      General: He is awake. He is  not in acute distress.    Appearance: He is well-developed and well-groomed. He is not ill-appearing or toxic-appearing.  HENT:     Head: Normocephalic and atraumatic.  Right Ear: Hearing, tympanic membrane, ear canal and external ear normal. No drainage.     Left Ear: Hearing, tympanic membrane, ear canal and external ear normal. No drainage.     Ears:     Comments: Hearing test passed bilaterally 5 ft right ear and 6 ft left ear.    Nose: Nose normal.     Mouth/Throat:     Pharynx: Uvula midline.  Eyes:     General: Lids are normal.        Right eye: No discharge.        Left eye: No discharge.     Extraocular Movements: Extraocular movements intact.     Conjunctiva/sclera: Conjunctivae normal.     Pupils: Pupils are equal, round, and reactive to light.     Visual Fields: Right eye visual fields normal and left eye visual fields normal.  Neck:     Thyroid : No thyromegaly.     Vascular: No carotid bruit or JVD.     Trachea: Trachea normal.  Cardiovascular:     Rate and Rhythm: Normal rate and regular rhythm.     Heart sounds: Normal heart sounds, S1 normal and S2 normal. No murmur heard.    No gallop.  Pulmonary:     Effort: Pulmonary effort is normal. No accessory muscle usage or respiratory distress.     Breath sounds: Normal breath sounds. No decreased breath sounds, wheezing or rales.  Abdominal:     General: Bowel sounds are normal.     Palpations: Abdomen is soft. There is no hepatomegaly or splenomegaly.     Tenderness: There is no abdominal tenderness.  Musculoskeletal:        General: Normal range of motion.     Cervical back: Normal range of motion and neck supple.     Right lower leg: No edema.     Left lower leg: No edema.  Lymphadenopathy:     Head:     Right side of head: No submental, submandibular, tonsillar, preauricular or posterior auricular adenopathy.     Left side of head: No submental, submandibular, tonsillar, preauricular or posterior auricular  adenopathy.     Cervical: No cervical adenopathy.  Skin:    General: Skin is warm and dry.     Capillary Refill: Capillary refill takes less than 2 seconds.     Findings: No rash.  Neurological:     Mental Status: He is alert and oriented to person, place, and time.     Cranial Nerves: Cranial nerves 2-12 are intact.     Motor: Motor function is intact.     Coordination: Coordination is intact.     Gait: Gait is intact.     Deep Tendon Reflexes: Reflexes are normal and symmetric.     Reflex Scores:      Brachioradialis reflexes are 2+ on the right side and 2+ on the left side.      Patellar reflexes are 2+ on the right side and 2+ on the left side. Psychiatric:        Attention and Perception: Attention normal.        Mood and Affect: Mood normal.        Speech: Speech normal.        Behavior: Behavior normal. Behavior is cooperative.        Thought Content: Thought content normal.        Cognition and Memory: Cognition normal.    Vision Screening   Right eye Left eye Both  eyes  Without correction 20/40 20/40 20/40   With correction       Results for orders placed or performed in visit on 10/15/24  POCT Urinalysis Dipstick   Collection Time: 10/15/24  1:29 PM  Result Value Ref Range   Color, UA normal    Clarity, UA clear    Glucose, UA Negative Negative   Bilirubin, UA neg    Ketones, UA neg    Spec Grav, UA 1.010 1.010 - 1.025   Blood, UA neg    pH, UA 7.5 5.0 - 8.0   Protein, UA Negative Negative   Urobilinogen, UA 1.0 0.2 or 1.0 E.U./dL   Nitrite, UA negative    Leukocytes, UA Negative Negative   Appearance clear    Odor normal       Assessment & Plan:   Problem List Items Addressed This Visit       Other   Encounter for examination required by Department of Transportation (DOT) - Primary   DOT Certificate provided x 2 years   Hearing test: Pass with whisper test 5 foot right ear and 6 foot left ear Vision: 20/40 R, 20/40 L, 20/40 Both Urine 1.010, Neg  Protein, Neg Glucose, Neg Hematuria         Relevant Orders   POCT Urinalysis Dipstick (Completed)   Hearing screening   Visual acuity screening     Follow up plan: Return if symptoms worsen or fail to improve.   PATIENT COUNSELING:    Advised to avoid cigarette smoking.  I discussed with the patient that most people either abstain from alcohol or drink within safe limits (<=14/week and <=4 drinks/occasion for males, <=7/weeks and <= 3 drinks/occasion for females) and that the risk for alcohol disorders and other health effects rises proportionally with the number of drinks per week and how often a drinker exceeds daily limits.  Discussed cessation/primary prevention of drug use and availability of treatment for abuse.   Diet: Encouraged to adjust caloric intake to maintain  or achieve ideal body weight, to reduce intake of dietary saturated fat and total fat, to limit sodium intake by avoiding high sodium foods and not adding table salt, and to maintain adequate dietary potassium and calcium preferably from fresh fruits, vegetables, and low-fat dairy products.    Stressed the importance of regular exercise  Injury prevention: Discussed safety belts, safety helmets, smoke detector, smoking near bedding or upholstery.   Dental health: Discussed importance of regular tooth brushing, flossing, and dental visits.    NEXT PREVENTATIVE PHYSICAL DUE IN 1 YEAR. Return if symptoms worsen or fail to improve.

## 2024-10-15 NOTE — Assessment & Plan Note (Signed)
 DOT Certificate provided x 2 years   Hearing test: Pass with whisper test 5 foot right ear and 6 foot left ear Vision: 20/40 R, 20/40 L, 20/40 Both Urine 1.010, Neg Protein, Neg Glucose, Neg Hematuria

## 2024-10-15 NOTE — Progress Notes (Signed)
 Hearing Screen Results  20 dB HL   Right Ear Left Ear  500 Hz Pass []  Fail [x]  Pass [x]  Fail []   1,000 Hz Pass []  Fail [x]  Pass [x]  Fail []   2,000 Hz Pass []  Fail [x]  Pass []  Fail [x]   4,000 Hz Pass []  Fail [x]  Pass [x]  Fail []     25 dB HL   Right Ear Left Ear  500 Hz Pass []  Fail [x]  Pass [x]  Fail []   1,000 Hz Pass []  Fail [x]  Pass [x]  Fail []   2,000 Hz Pass [x]  Fail []  Pass []  Fail [x]   4,000 Hz Pass []  Fail [x]  Pass [x]  Fail []     40 dB HL   Right Ear Left Ear  500 Hz Pass []  Fail [x]  Pass [x]  Fail []   1,000 Hz Pass []  Fail [x]  Pass [x]  Fail []   2,000 Hz Pass [x]  Fail []  Pass []  Fail [x]   4,000 Hz Pass []  Fail [x]  Pass [x]  Fail []    Comments:

## 2024-11-01 ENCOUNTER — Other Ambulatory Visit: Payer: Self-pay | Admitting: Family Medicine

## 2024-11-01 DIAGNOSIS — G8929 Other chronic pain: Secondary | ICD-10-CM

## 2024-11-05 NOTE — Telephone Encounter (Signed)
 ERx

## 2024-12-19 ENCOUNTER — Encounter: Payer: Self-pay | Admitting: Family Medicine

## 2024-12-19 ENCOUNTER — Ambulatory Visit (INDEPENDENT_AMBULATORY_CARE_PROVIDER_SITE_OTHER): Admitting: Family Medicine

## 2024-12-19 VITALS — BP 114/78 | HR 62 | Temp 97.8°F | Ht 69.49 in | Wt 182.0 lb

## 2024-12-19 DIAGNOSIS — J3489 Other specified disorders of nose and nasal sinuses: Secondary | ICD-10-CM | POA: Diagnosis not present

## 2024-12-19 MED ORDER — FLUTICASONE PROPIONATE 50 MCG/ACT NA SUSP: 2.0000 | Freq: Every day | NASAL | 11 refills | Status: AC

## 2024-12-19 MED ORDER — MUPIROCIN 2 % EX OINT: TOPICAL_OINTMENT | Freq: Three times a day (TID) | CUTANEOUS | 0 refills | Status: AC

## 2024-12-19 NOTE — Progress Notes (Signed)
 " Ph: [phone] Fax: [phone]   Patient ID: William Richardson Shlomo Mickey., male    DOB: 1956-03-11, 68 y.o.   MRN: [id number]  This visit was conducted in person.  BP 114/78 (Cuff Size: Normal)   Pulse 62   Temp 97.8 F (36.6 C) (Oral)   Ht 5' 9.49 (1.765 m)   Wt 182 lb (82.6 kg)   SpO2 96%   BMI 26.50 kg/m    CC: nasal sore  Subjective:   HPI: Therin Vetsch. is a 68 y.o. male presenting on 12/19/2024 for Acute Visit (Sore inside of right nasal cavity, Bleeds on and off, uncomfortable/ /pt has been putting antibiotic cream on it but no noticeable difference/Pt has hx of staph infection and thinks it may be same thing)   3-4 wk h/o poorly healing sore to right inner nose, occ nosebleeds.  Treating with triple abx cream, otc gel.   No fevers/chills, drainage no spreading redness of skin.   Previous h/o this.  Prior h/o staph infections.   He requests flonase  refilled     Relevant past medical, surgical, family and social history reviewed and updated as indicated. Interim medical history since our last visit reviewed. Allergies and medications reviewed and updated. Outpatient Medications Prior to Visit  Medication Sig Dispense Refill   Ascorbic Acid (VITAMIN C PO) Take by mouth.     B Complex-C (SUPER B COMPLEX PO) Take by mouth.     ELDERBERRY-VITAMIN C-ZINC PO Take by mouth. +vitamin D     Glucosamine HCl (GLUCOSAMINE PO) Take by mouth.     MAGNESIUM PO Take by mouth.     meloxicam  (MOBIC ) 15 MG tablet TAKE 1 TABLET BY MOUTH ONCE DAILY AS NEEDED FOR PAIN (Patient taking differently: Take 15 mg by mouth as needed for pain.) 90 tablet 1   Multiple Vitamins-Minerals (CENTRUM PO) Take by mouth.     Omega-3 Fatty Acids (OMEGA-3 FISH OIL PO) Take by mouth.     Probiotic Product (PROBIOTIC PO) Take by mouth.     tadalafil  (CIALIS ) 5 MG tablet Take 1 tablet (5 mg total) by mouth daily. 90 tablet 2   triamcinolone  cream (KENALOG ) 0.1 % Apply 1 Application  topically 2 (two) times daily. (Patient taking differently: Apply 1 Application topically 2 (two) times daily as needed.) 30 g 0   TURMERIC PO Take by mouth.     valACYclovir  (VALTREX ) 1000 MG tablet Take 2 tablets (2,000 mg total) by mouth 2 (two) times daily. For one day per episode of cold sore as needed (Patient taking differently: Take 2,000 mg by mouth 2 (two) times daily as needed. For one day per episode of cold sore as needed) 16 tablet 1   vitamin B-12 (CYANOCOBALAMIN ) 1000 MCG tablet Take 1 tablet (1,000 mcg total) by mouth daily. 90 tablet 1   VITAMIN E PO Take by mouth.     fluticasone  (FLONASE ) 50 MCG/ACT nasal spray Place 2 sprays into both nostrils daily. 16 g 12   No facility-administered medications prior to visit.     Per HPI unless specifically indicated in ROS section below Review of Systems  Objective:  BP 114/78 (Cuff Size: Normal)   Pulse 62   Temp 97.8 F (36.6 C) (Oral)   Ht 5' 9.49 (1.765 m)   Wt 182 lb (82.6 kg)   SpO2 96%   BMI 26.50 kg/m   Wt Readings from Last 3 Encounters:  12/19/24 182 lb (82.6 kg)  10/15/24 190 lb (  86.2 kg)  03/26/24 193 lb 8 oz (87.8 kg)      Physical Exam Vitals and nursing note reviewed.  Constitutional:      Appearance: Normal appearance. He is not ill-appearing.  HENT:     Head: Normocephalic and atraumatic.     Nose: Nose normal. No congestion or rhinorrhea.     Right Nostril: No foreign body.     Left Nostril: No foreign body.     Right Turbinates: Not enlarged, swollen or pale.     Left Turbinates: Not enlarged, swollen or pale.     Right Sinus: No frontal sinus tenderness.     Left Sinus: No frontal sinus tenderness.      Comments: Dry sore sores x2 to inner nare septal side    Mouth/Throat:     Mouth: Mucous membranes are moist.     Pharynx: Oropharynx is clear. No oropharyngeal exudate or posterior oropharyngeal erythema.  Eyes:     Extraocular Movements: Extraocular movements intact.     Pupils: Pupils  are equal, round, and reactive to light.  Musculoskeletal:     Cervical back: Normal range of motion and neck supple.  Lymphadenopathy:     Cervical: No cervical adenopathy.  Neurological:     Mental Status: He is alert.        Assessment & Plan:   Problem List Items Addressed This Visit     Nasal sore - Primary   Poorly healing wound despite OTC triple abx cream use  Endorses h/o staph infection but doesn't know if MRSA. Will cover for MRSA given poorly healing with mupirocin - rec BID x5d.  Nothing to culture today.  Update if not improving with treatment. No signs of cellulitis or further infection today.         Meds ordered this encounter  Medications   fluticasone  (FLONASE ) 50 MCG/ACT nasal spray    Sig: Place 2 sprays into both nostrils daily.    Dispense:  16 g    Refill:  11   mupirocin ointment (BACTROBAN) 2 %    Sig: Apply topically 3 (three) times daily.    Dispense:  30 g    Refill:  0    Fill cream vs ointment based on which is preferred by insurance    No orders of the defined types were placed in this encounter.   Patient Instructions  Possible MRSA however nothing to culture currently as sore is dry.  Treat with mupriocin topical antibiotic - apply to both nostrils twice daily for 5 days  Let us  know if ongoing or worsening symptoms   Follow up plan: No follow-ups on file.  Anton Blas, MD   "

## 2024-12-19 NOTE — Assessment & Plan Note (Signed)
 Poorly healing wound despite OTC triple abx cream use  Endorses h/o staph infection but doesn't know if MRSA. Will cover for MRSA given poorly healing with mupirocin - rec BID x5d.  Nothing to culture today.  Update if not improving with treatment. No signs of cellulitis or further infection today.

## 2024-12-19 NOTE — Patient Instructions (Addendum)
 Possible MRSA however nothing to culture currently as sore is dry.  Treat with mupriocin topical antibiotic - apply to both nostrils twice daily for 5 days  Let us  know if ongoing or worsening symptoms

## 2025-01-01 ENCOUNTER — Encounter: Payer: Self-pay | Admitting: Family Medicine

## 2025-02-15 ENCOUNTER — Ambulatory Visit: Payer: No Typology Code available for payment source

## 2025-03-25 ENCOUNTER — Other Ambulatory Visit

## 2025-04-01 ENCOUNTER — Encounter: Admitting: Family Medicine
